# Patient Record
Sex: Male | Born: 1985 | Race: White | Hispanic: No | Marital: Single | Smoking: Current every day smoker
Health system: Southern US, Community
[De-identification: ages and names within clinical notes are randomized; demographics above are authoritative.]

## PROBLEM LIST (undated history)

## (undated) DIAGNOSIS — R569 Unspecified convulsions: Secondary | ICD-10-CM

## (undated) DIAGNOSIS — F909 Attention-deficit hyperactivity disorder, unspecified type: Secondary | ICD-10-CM

## (undated) DIAGNOSIS — K56609 Unspecified intestinal obstruction, unspecified as to partial versus complete obstruction: Secondary | ICD-10-CM

## (undated) DIAGNOSIS — I219 Acute myocardial infarction, unspecified: Secondary | ICD-10-CM

## (undated) HISTORY — PX: APPENDECTOMY: SHX54

## (undated) HISTORY — PX: ABDOMINAL SURGERY: SHX537

## (undated) HISTORY — PX: FRACTURE SURGERY: SHX138

---

## 2014-08-27 ENCOUNTER — Emergency Department (HOSPITAL_COMMUNITY)
Admission: EM | Admit: 2014-08-27 | Discharge: 2014-08-27 | Disposition: A | Discharge status: Home / Self Care | Payer: Self-pay | Attending: Emergency Medicine | Admitting: Emergency Medicine

## 2014-08-27 ENCOUNTER — Encounter (HOSPITAL_COMMUNITY): Payer: Self-pay | Admitting: *Deleted

## 2014-08-27 ENCOUNTER — Emergency Department (HOSPITAL_COMMUNITY): Payer: Self-pay

## 2014-08-27 DIAGNOSIS — Z8719 Personal history of other diseases of the digestive system: Secondary | ICD-10-CM | POA: Insufficient documentation

## 2014-08-27 DIAGNOSIS — Y92410 Unspecified street and highway as the place of occurrence of the external cause: Secondary | ICD-10-CM | POA: Insufficient documentation

## 2014-08-27 DIAGNOSIS — Y998 Other external cause status: Secondary | ICD-10-CM | POA: Insufficient documentation

## 2014-08-27 DIAGNOSIS — Z720 Tobacco use: Secondary | ICD-10-CM | POA: Insufficient documentation

## 2014-08-27 DIAGNOSIS — Y9389 Activity, other specified: Secondary | ICD-10-CM | POA: Insufficient documentation

## 2014-08-27 DIAGNOSIS — S83422A Sprain of lateral collateral ligament of left knee, initial encounter: Secondary | ICD-10-CM

## 2014-08-27 DIAGNOSIS — S8392XA Sprain of unspecified site of left knee, initial encounter: Secondary | ICD-10-CM | POA: Insufficient documentation

## 2014-08-27 DIAGNOSIS — S8002XA Contusion of left knee, initial encounter: Secondary | ICD-10-CM | POA: Insufficient documentation

## 2014-08-27 DIAGNOSIS — M25562 Pain in left knee: Secondary | ICD-10-CM

## 2014-08-27 HISTORY — DX: Unspecified intestinal obstruction, unspecified as to partial versus complete obstruction: K56.609

## 2014-08-27 HISTORY — DX: Unspecified convulsions: R56.9

## 2014-08-27 MED ORDER — OXYCODONE HCL 5 MG PO TABS: 5.0000 mg | ORAL_TABLET | ORAL | Status: DC | PRN

## 2014-08-27 NOTE — ED Notes (Signed)
MD at bedside. 
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
## 2014-08-27 NOTE — Discharge Instructions (Signed)
Knee Ligament Injury, Arthroscopy Arthroscopy is a surgical technique in which your health care provider examines your knee through a small, pencil-sized telescope (arthroscope). Often, repairs to injured ligaments can be done with instruments in the arthroscope. Arthroscopy is less invasive than open-knee surgery.  LET [facility] CARE PROVIDER KNOW ABOUT:  Any allergies you have.  All medicines you are taking, including vitamins, herbs, eye drops, creams, and over-the-counter medicines.  Previous problems you or members of your family have had with the use of anesthetics.  Any blood disorders you have.  Previous surgeries you have had.  Medical conditions you have. RISKS AND COMPLICATIONS  Generally, this is a safe procedure. However, as with any procedure, problems can occur. Possible problems include:  Infection.  Bleeding.  Stiffness. BEFORE THE PROCEDURE  Ask your health care provider about changing or stopping any regular medicines. Avoid taking aspirin or blood thinners as directed by your health care provider.   Do not eat or drink anything after midnight the night before surgery.   If you smoke, do not smoke for at least 2 weeks before your surgery.   Do not drink alcohol starting the day before your surgery.   Let your health care provider know if you develop a cold or any infection before your surgery.   Arrange for someone to drive you home after the surgery or after your hospital stay. Also arrange for someone to help you with activities during recovery.  PROCEDURE  Small monitors will be put on your body. They are used to check your heart, blood pressure, and oxygen levels.   An IV access tube will be put into one of your veins. Medicine will be able to flow directly into your body through this IV tube.   You might be given a medicine to help you relax (sedative).   You will be given a medicine that makes you go to sleep (general anesthetic), and a  breathing tube will be placed into your lungs during the procedure.  Several small incisions are made in your knee. Saline fluid is placed into one of the incisions to expand the knee and clear away any blood in the knee.  Your health care provider will insert the arthroscope to examine the injured knee.  During arthroscopy, your health care provider may find a partial or complete tear in a ligament.  Tools can be inserted through the other incisions to repair the injured ligaments.  The incisions are then closed with absorbable stitches and covered with dressings. AFTER THE PROCEDURE  You will be taken to the recovery area where you will be monitored.  When you are awake, stable, and taking fluids without problems, you will be allowed to go home. Document Released: 01/11/2000 Document Revised: 01/18/2013 Document Reviewed: 08/25/2012 [facility] Patient Information 2015 [place], [place]. This information is not intended to replace advice given to you by your health care provider. Make sure you discuss any questions you have with your health care provider.

## 2016-01-02 DIAGNOSIS — F17210 Nicotine dependence, cigarettes, uncomplicated: Secondary | ICD-10-CM

## 2016-01-02 DIAGNOSIS — E669 Obesity, unspecified: Secondary | ICD-10-CM

## 2016-01-02 DIAGNOSIS — F1210 Cannabis abuse, uncomplicated: Secondary | ICD-10-CM

## 2016-02-14 ENCOUNTER — Encounter (HOSPITAL_COMMUNITY): Payer: Self-pay

## 2016-02-14 ENCOUNTER — Emergency Department (HOSPITAL_COMMUNITY): Payer: Self-pay

## 2016-02-14 ENCOUNTER — Emergency Department (HOSPITAL_COMMUNITY)
Admission: EM | Admit: 2016-02-14 | Discharge: 2016-02-14 | Disposition: A | Discharge status: Home / Self Care | Payer: Self-pay | Attending: Emergency Medicine | Admitting: Emergency Medicine

## 2016-02-14 DIAGNOSIS — R1013 Epigastric pain: Secondary | ICD-10-CM

## 2016-02-14 DIAGNOSIS — F17200 Nicotine dependence, unspecified, uncomplicated: Secondary | ICD-10-CM | POA: Insufficient documentation

## 2016-02-14 DIAGNOSIS — K529 Noninfective gastroenteritis and colitis, unspecified: Secondary | ICD-10-CM | POA: Insufficient documentation

## 2016-02-14 LAB — CBC WITH DIFFERENTIAL/PLATELET
Basophils Absolute: 0 10*3/uL (ref 0.0–0.1)
Basophils Relative: 0 %
Eosinophils Absolute: 0.2 10*3/uL (ref 0.0–0.7)
Eosinophils Relative: 2 %
HCT: 46.9 % (ref 39.0–52.0)
HEMOGLOBIN: 16 g/dL (ref 13.0–17.0)
Lymphocytes Relative: 21 %
Lymphs Abs: 2.3 10*3/uL (ref 0.7–4.0)
MCH: 29.6 pg (ref 26.0–34.0)
MCHC: 34.1 g/dL (ref 30.0–36.0)
MCV: 86.7 fL (ref 78.0–100.0)
MONO ABS: 1 10*3/uL (ref 0.1–1.0)
Monocytes Relative: 9 %
NEUTROS ABS: 7.5 10*3/uL (ref 1.7–7.7)
NEUTROS PCT: 68 %
Platelets: 248 10*3/uL (ref 150–400)
RBC: 5.41 MIL/uL (ref 4.22–5.81)
RDW: 14.1 % (ref 11.5–15.5)
WBC: 10.9 10*3/uL — ABNORMAL HIGH (ref 4.0–10.5)

## 2016-02-14 LAB — COMPREHENSIVE METABOLIC PANEL
ALK PHOS: 93 U/L (ref 38–126)
ALT: 28 U/L (ref 17–63)
AST: 22 U/L (ref 15–41)
Albumin: 4.1 g/dL (ref 3.5–5.0)
Anion gap: 9 (ref 5–15)
BUN: 9 mg/dL (ref 6–20)
CO2: 22 mmol/L (ref 22–32)
CREATININE: 0.82 mg/dL (ref 0.61–1.24)
Calcium: 9.2 mg/dL (ref 8.9–10.3)
Chloride: 106 mmol/L (ref 101–111)
GFR calc Af Amer: >60 mL/min (ref >60)
Glucose, Bld: 85 mg/dL (ref 65–99)
POTASSIUM: 3.8 mmol/L (ref 3.5–5.1)
Sodium: 137 mmol/L (ref 135–145)
Total Bilirubin: 0.8 mg/dL (ref 0.3–1.2)
Total Protein: 7.1 g/dL (ref 6.5–8.1)

## 2016-02-14 LAB — LIPASE, BLOOD: Lipase: 14 U/L (ref 11–51)

## 2016-02-14 LAB — I-STAT CG4 LACTIC ACID, ED: Lactic Acid, Venous: 0.7 mmol/L (ref 0.5–1.9)

## 2016-02-14 MED ORDER — DICYCLOMINE HCL 20 MG PO TABS
20.0000 mg | ORAL_TABLET | Freq: Three times a day (TID) | ORAL | 0 refills | Status: DC | PRN
Start: 2016-02-14 — End: 2017-09-30

## 2016-02-14 MED ORDER — SODIUM CHLORIDE 0.9 % IV BOLUS (SEPSIS)
1000.0000 mL | Freq: Once | INTRAVENOUS | Status: AC
  Administered 2016-02-14: 1000 mL via INTRAVENOUS

## 2016-02-14 MED ORDER — IOPAMIDOL (ISOVUE-300) INJECTION 61%
INTRAVENOUS | Status: AC
  Administered 2016-02-14: 100 mL via INTRAVENOUS
  Filled 2016-02-14: qty 100

## 2016-02-14 MED ORDER — ONDANSETRON HCL 4 MG/2ML IJ SOLN
4.0000 mg | Freq: Once | INTRAMUSCULAR | Status: AC
  Administered 2016-02-14: 4 mg via INTRAVENOUS
  Filled 2016-02-14: qty 2

## 2016-02-14 MED ORDER — ONDANSETRON 4 MG PO TBDP: 4.0000 mg | ORAL_TABLET | Freq: Three times a day (TID) | ORAL | 0 refills | Status: DC | PRN

## 2016-02-14 MED ORDER — HYDROMORPHONE HCL 2 MG/ML IJ SOLN
1.0000 mg | Freq: Once | INTRAMUSCULAR | Status: AC
  Administered 2016-02-14: 1 mg via INTRAVENOUS
  Filled 2016-02-14: qty 1

## 2016-02-14 MED ORDER — KETOROLAC TROMETHAMINE 30 MG/ML IJ SOLN
30.0000 mg | Freq: Once | INTRAMUSCULAR | Status: AC
  Administered 2016-02-14: 30 mg via INTRAVENOUS
  Filled 2016-02-14: qty 1

## 2016-02-14 MED ORDER — CIPROFLOXACIN HCL 500 MG PO TABS: 500.0000 mg | ORAL_TABLET | Freq: Two times a day (BID) | ORAL | 0 refills | Status: AC

## 2016-02-14 MED ORDER — METRONIDAZOLE 500 MG PO TABS: 500.0000 mg | ORAL_TABLET | Freq: Two times a day (BID) | ORAL | 0 refills | Status: AC

## 2016-02-14 NOTE — ED Notes (Signed)
Pt will be picked up by friend, instructed not to drive due to medications

## 2016-02-14 NOTE — ED Triage Notes (Signed)
Pt arrived via EMS from home c/o generalized abdominal pain x3 days worse today.  Hx of bowel obstruction with surgical intervention 7 years ago.

## 2016-02-16 NOTE — ED Provider Notes (Signed)
[facility]-EMERGENCY DEPT Provider Note   CSN: [id number] Arrival date & time: 02/14/16  1619     History   Chief Complaint Chief Complaint  Patient presents with  . Abdominal Pain    HPI Cameron Preston is a 31 y.o. male.  HPI  Presents with concern for generalized abdominal pain, worse in the epigastrium. Patient reports isn't present for approximately one month, however worsened 3 days ago. Describes it as a grabbing severe cramping pain. History of bowel obstruction with exploratory laparotomy in the past, and this feels similar. Reports he's also had nausea and vomiting as well as diarrhea. No urinary symptoms. No fevers. Past Medical History:  Diagnosis Date  . Bowel obstruction   . Seizures (HCC)     There are no active problems to display for this patient.   Past Surgical History:  Procedure Laterality Date  . ABDOMINAL SURGERY    . APPENDECTOMY    . FRACTURE SURGERY     hand repair       Home Medications    Prior to Admission medications   Medication Sig Start Date End Date Taking? Authorizing Provider  ciprofloxacin (CIPRO) 500 MG tablet Take 1 tablet (500 mg total) by mouth every 12 (twelve) hours. 02/14/16 02/21/16  Alvira Monday, MD  dicyclomine (BENTYL) 20 MG tablet Take 1 tablet (20 mg total) by mouth 3 (three) times daily as needed for spasms. 02/14/16   Alvira Monday, MD  ibuprofen (ADVIL,MOTRIN) 200 MG tablet Take 200 mg by mouth every 6 (six) hours as needed.    Historical Provider, MD  metroNIDAZOLE (FLAGYL) 500 MG tablet Take 1 tablet (500 mg total) by mouth 2 (two) times daily. 02/14/16 02/21/16  Alvira Monday, MD  ondansetron (ZOFRAN ODT) 4 MG disintegrating tablet Take 1 tablet (4 mg total) by mouth every 8 (eight) hours as needed for nausea or vomiting. 02/14/16   Alvira Monday, MD  oxyCODONE (ROXICODONE) 5 MG immediate release tablet Take 1 tablet (5 mg total) by mouth every 4 (four) hours as needed for severe pain. 08/27/14   Ames Dura, MD     Family History History reviewed. No pertinent family history.  Social History Social History  Substance Use Topics  . Smoking status: Current Some Day Smoker    Packs/day: 0.50  . Smokeless tobacco: Never Used  . Alcohol use No     Comment: o     Allergies   Klonopin [clonazepam]; Valium [diazepam]; Xanax [alprazolam]; and Tylenol [acetaminophen]   Review of Systems Review of Systems  Constitutional: Negative for fever.  HENT: Negative for sore throat.   Eyes: Negative for visual disturbance.  Respiratory: Negative for shortness of breath.   Cardiovascular: Negative for chest pain.  Gastrointestinal: Positive for abdominal pain, diarrhea, nausea and vomiting.  Genitourinary: Negative for difficulty urinating.  Musculoskeletal: Negative for back pain and neck stiffness.  Skin: Negative for rash.  Neurological: Negative for syncope and headaches.     Physical Exam Updated Vital Signs BP 106/75   Pulse 74   Temp 98.5 F (36.9 C) (Oral)   Ht 5\' 7"  (1.702 m)   Wt 180 lb (81.6 kg)   SpO2 95%   BMI 28.19 kg/m   Physical Exam  Constitutional: He is oriented to person, place, and time. He appears well-developed and well-nourished. No distress.  HENT:  Head: Normocephalic and atraumatic.  Eyes: Conjunctivae and EOM are normal.  Neck: Normal range of motion.  Cardiovascular: Normal rate, regular rhythm, normal heart sounds and intact  distal pulses.  Exam reveals no gallop and no friction rub.   No murmur heard. Pulmonary/Chest: Effort normal and breath sounds normal. No respiratory distress. He has no wheezes. He has no rales.  Abdominal: Soft. He exhibits no distension. There is tenderness (epigastric worse however diffuse). There is no guarding.  Musculoskeletal: He exhibits no edema.  Neurological: He is alert and oriented to person, place, and time.  Skin: Skin is warm and dry. He is not diaphoretic.  Nursing note and vitals reviewed.    ED Treatments /  Results  Labs (all labs ordered are listed, but only abnormal results are displayed) Labs Reviewed  CBC WITH DIFFERENTIAL/PLATELET - Abnormal; Notable for the following:       Result Value   WBC 10.9 (*)    All other components within normal limits  COMPREHENSIVE METABOLIC PANEL  LIPASE, BLOOD  I-STAT CG4 LACTIC ACID, ED    EKG  EKG Interpretation None       Radiology Ct Abdomen Pelvis W Contrast  Result Date: 02/14/2016 CLINICAL DATA:  31 year old male with generalized abdominal pain for the past 3 days worsened today. History of small-bowel obstruction 7 years ago stating that abdominal discomfort feels similar today. EXAM: CT ABDOMEN AND PELVIS WITH CONTRAST TECHNIQUE: Multidetector CT imaging of the abdomen and pelvis was performed using the standard protocol following bolus administration of intravenous contrast. CONTRAST:  ISOVUE-300 IOPAMIDOL (ISOVUE-300) INJECTION 61% COMPARISON:  CT the abdomen and pelvis 04/16/2010. FINDINGS: Comment: Portions of today's examination are limited by considerable gross patient motion. Lower chest: Unremarkable. Hepatobiliary: No cystic or solid hepatic lesions. No intra or extrahepatic biliary ductal dilatation. Gallbladder is normal in appearance. Pancreas: No pancreatic mass. No pancreatic ductal dilatation. No pancreatic or peripancreatic fluid or inflammatory changes. Spleen: Unremarkable. Adrenals/Urinary Tract: Bilateral adrenal glands and bilateral kidneys are normal in appearance. No hydroureteronephrosis. Urinary bladder is normal in appearance. Stomach/Bowel: The appearance of the stomach is normal. There is some mild dilatation of the proximal jejunum which measures up to 4.1 cm in diameter. Thereafter, the mid jejunum demonstrates some circumferential wall thickening. More distal loops of small bowel are relatively normal in caliber, while some are decompressed. Gas, stool and liquid is noted throughout the colon and rectum. Status  post cholecystectomy. Surgical clips are noted in the central small bowel mesenteric. Vascular/Lymphatic: Mild atherosclerotic disease noted in the pelvic vasculature. No lymphadenopathy noted in the abdomen or pelvis. Reproductive: Prostate gland and seminal vesicles are unremarkable in appearance. Other: No significant volume of ascites.  No pneumoperitoneum. Musculoskeletal: There are no aggressive appearing lytic or blastic lesions noted in the visualized portions of the skeleton. IMPRESSION: 1. While there is very mild dilatation of the proximal jejunum, this does not appear to be related to frank bowel obstruction. The mid jejunum demonstrates some mural thickening. Overall, findings are favored to reflect an enteritis. 2. Mild atherosclerosis. 3. Additional incidental findings, as above. Electronically Signed   By: Trudie Reed M.D.   On: 02/14/2016 17:50    Procedures Procedures (including critical care time)  Medications Ordered in ED Medications  sodium chloride 0.9 % bolus 1,000 mL (0 mLs Intravenous Stopped 02/14/16 1910)  HYDROmorphone (DILAUDID) injection 1 mg (1 mg Intravenous Given 02/14/16 1701)  ondansetron (ZOFRAN) injection 4 mg (4 mg Intravenous Given 02/14/16 1700)  iopamidol (ISOVUE-300) 61 % injection (100 mLs Intravenous Contrast Given 02/14/16 1714)  ketorolac (TORADOL) 30 MG/ML injection 30 mg (30 mg Intravenous Given 02/14/16 1902)     Initial  Impression / Assessment and Plan / ED Course  I have reviewed the triage vital signs and the nursing notes.  Pertinent labs & imaging results that were available during my care of the patient were reviewed by me and considered in my medical decision making (see chart for details).    31 year old male with a history of prior exploratory laparotomy in setting of suspected bowel obstruction, presents with concern for nausea, vomiting and abdominal pain. CT abdomen and pelvis showed no sign of obstruction, however did show mild  dilation of the proximal jejunum with some mural thickening, favored to represent an enteritis. Other labs did not show significant findings.  Suspect likely infectious etiology. Possible viral, however we'll treat for bacterial infection with Flagyl and Cipro. Patient given prescription for Zofran, Bentyl and antibiotics. Patient discharged in stable condition with understanding of reasons to return.   Final Clinical Impressions(s) / ED Diagnoses   Final diagnoses:  Epigastric pain  Jejunitis    New Prescriptions Discharge Medication List as of 02/14/2016  6:57 PM    START taking these medications   Details  ciprofloxacin (CIPRO) 500 MG tablet Take 1 tablet (500 mg total) by mouth every 12 (twelve) hours., Starting Thu 02/14/2016, Until Thu 02/21/2016, Print    dicyclomine (BENTYL) 20 MG tablet Take 1 tablet (20 mg total) by mouth 3 (three) times daily as needed for spasms., Starting Thu 02/14/2016, Print    metroNIDAZOLE (FLAGYL) 500 MG tablet Take 1 tablet (500 mg total) by mouth 2 (two) times daily., Starting Thu 02/14/2016, Until Thu 02/21/2016, Print    ondansetron (ZOFRAN ODT) 4 MG disintegrating tablet Take 1 tablet (4 mg total) by mouth every 8 (eight) hours as needed for nausea or vomiting., Starting Thu 02/14/2016, Print         Alvira Monday, MD 02/16/16 [phone]

## 2017-05-27 DIAGNOSIS — I219 Acute myocardial infarction, unspecified: Secondary | ICD-10-CM

## 2017-05-27 HISTORY — DX: Acute myocardial infarction, unspecified: I21.9

## 2017-09-30 ENCOUNTER — Other Ambulatory Visit: Payer: Self-pay

## 2017-09-30 ENCOUNTER — Encounter (HOSPITAL_COMMUNITY): Payer: Self-pay | Admitting: Emergency Medicine

## 2017-09-30 ENCOUNTER — Emergency Department (HOSPITAL_COMMUNITY): Payer: Self-pay

## 2017-09-30 ENCOUNTER — Inpatient Hospital Stay (HOSPITAL_COMMUNITY)
Admission: EM | Admit: 2017-09-30 | Discharge: 2017-10-06 | DRG: 464 | Disposition: A | Discharge status: Home / Self Care | Payer: Self-pay | Attending: Family Medicine | Admitting: Family Medicine

## 2017-09-30 DIAGNOSIS — L02416 Cutaneous abscess of left lower limb: Secondary | ICD-10-CM | POA: Diagnosis present

## 2017-09-30 DIAGNOSIS — B9562 Methicillin resistant Staphylococcus aureus infection as the cause of diseases classified elsewhere: Secondary | ICD-10-CM | POA: Diagnosis present

## 2017-09-30 DIAGNOSIS — L03119 Cellulitis of unspecified part of limb: Secondary | ICD-10-CM

## 2017-09-30 DIAGNOSIS — F1210 Cannabis abuse, uncomplicated: Secondary | ICD-10-CM | POA: Diagnosis present

## 2017-09-30 DIAGNOSIS — I252 Old myocardial infarction: Secondary | ICD-10-CM

## 2017-09-30 DIAGNOSIS — L02419 Cutaneous abscess of limb, unspecified: Secondary | ICD-10-CM | POA: Diagnosis present

## 2017-09-30 DIAGNOSIS — F17200 Nicotine dependence, unspecified, uncomplicated: Secondary | ICD-10-CM | POA: Diagnosis present

## 2017-09-30 DIAGNOSIS — F901 Attention-deficit hyperactivity disorder, predominantly hyperactive type: Secondary | ICD-10-CM

## 2017-09-30 DIAGNOSIS — Z8614 Personal history of Methicillin resistant Staphylococcus aureus infection: Secondary | ICD-10-CM

## 2017-09-30 DIAGNOSIS — L03116 Cellulitis of left lower limb: Secondary | ICD-10-CM | POA: Diagnosis present

## 2017-09-30 DIAGNOSIS — M71061 Abscess of bursa, right knee: Principal | ICD-10-CM | POA: Diagnosis present

## 2017-09-30 DIAGNOSIS — F909 Attention-deficit hyperactivity disorder, unspecified type: Secondary | ICD-10-CM | POA: Diagnosis present

## 2017-09-30 DIAGNOSIS — G40909 Epilepsy, unspecified, not intractable, without status epilepticus: Secondary | ICD-10-CM

## 2017-09-30 DIAGNOSIS — Z9114 Patient's other noncompliance with medication regimen: Secondary | ICD-10-CM

## 2017-09-30 DIAGNOSIS — L03115 Cellulitis of right lower limb: Secondary | ICD-10-CM | POA: Diagnosis present

## 2017-09-30 DIAGNOSIS — A419 Sepsis, unspecified organism: Secondary | ICD-10-CM | POA: Diagnosis present

## 2017-09-30 HISTORY — DX: Acute myocardial infarction, unspecified: I21.9

## 2017-09-30 HISTORY — DX: Attention-deficit hyperactivity disorder, unspecified type: F90.9

## 2017-09-30 LAB — CBC WITH DIFFERENTIAL/PLATELET
Abs Immature Granulocytes: 0.1 10*3/uL (ref 0.0–0.1)
Basophils Absolute: 0.1 10*3/uL (ref 0.0–0.1)
Basophils Relative: 0 %
Eosinophils Absolute: 0.1 10*3/uL (ref 0.0–0.7)
Eosinophils Relative: 0 %
HCT: 46.1 % (ref 39.0–52.0)
HEMOGLOBIN: 15 g/dL (ref 13.0–17.0)
IMMATURE GRANULOCYTES: 0 %
LYMPHS ABS: 2 10*3/uL (ref 0.7–4.0)
LYMPHS PCT: 11 %
MCH: 29.3 pg (ref 26.0–34.0)
MCHC: 32.5 g/dL (ref 30.0–36.0)
MCV: 90 fL (ref 78.0–100.0)
Monocytes Absolute: 1.6 10*3/uL — ABNORMAL HIGH (ref 0.1–1.0)
Monocytes Relative: 9 %
NEUTROS PCT: 80 %
Neutro Abs: 14.7 10*3/uL — ABNORMAL HIGH (ref 1.7–7.7)
Platelets: 291 10*3/uL (ref 150–400)
RBC: 5.12 MIL/uL (ref 4.22–5.81)
RDW: 13.2 % (ref 11.5–15.5)
WBC: 18.5 10*3/uL — AB (ref 4.0–10.5)

## 2017-09-30 LAB — MRSA PCR SCREENING: MRSA by PCR: NEGATIVE

## 2017-09-30 LAB — COMPREHENSIVE METABOLIC PANEL
ALT: 23 U/L (ref 0–44)
AST: 28 U/L (ref 15–41)
Albumin: 3.9 g/dL (ref 3.5–5.0)
Alkaline Phosphatase: 99 U/L (ref 38–126)
Anion gap: 11 (ref 5–15)
BUN: 9 mg/dL (ref 6–20)
CHLORIDE: 102 mmol/L (ref 98–111)
CO2: 24 mmol/L (ref 22–32)
Calcium: 9 mg/dL (ref 8.9–10.3)
Creatinine, Ser: 0.72 mg/dL (ref 0.61–1.24)
GFR calc non Af Amer: >60 mL/min (ref >60)
Glucose, Bld: 123 mg/dL — ABNORMAL HIGH (ref 70–99)
Potassium: 3.6 mmol/L (ref 3.5–5.1)
Sodium: 137 mmol/L (ref 135–145)
Total Bilirubin: 0.9 mg/dL (ref 0.3–1.2)
Total Protein: 7.6 g/dL (ref 6.5–8.1)

## 2017-09-30 LAB — I-STAT CG4 LACTIC ACID, ED: LACTIC ACID, VENOUS: 0.99 mmol/L (ref 0.5–1.9)

## 2017-09-30 LAB — VALPROIC ACID LEVEL: Valproic Acid Lvl: <10 ug/mL — ABNORMAL LOW (ref 50.0–100.0)

## 2017-09-30 LAB — PROCALCITONIN: Procalcitonin: 0.27 ng/mL

## 2017-09-30 MED ORDER — DIVALPROEX SODIUM 250 MG PO DR TAB
250.0000 mg | DELAYED_RELEASE_TABLET | Freq: Two times a day (BID) | ORAL | Status: DC
  Administered 2017-09-30 (×2): 250 mg via ORAL
  Filled 2017-09-30 (×2): qty 1

## 2017-09-30 MED ORDER — FAMOTIDINE 20 MG PO TABS
10.0000 mg | ORAL_TABLET | Freq: Every day | ORAL | Status: DC
  Administered 2017-09-30: 10 mg via ORAL
  Filled 2017-09-30: qty 1

## 2017-09-30 MED ORDER — POVIDONE-IODINE 10 % EX SWAB: 2.0000 "application " | Freq: Once | CUTANEOUS | Status: DC

## 2017-09-30 MED ORDER — IBUPROFEN 600 MG PO TABS: 600.0000 mg | ORAL_TABLET | Freq: Four times a day (QID) | ORAL | Status: DC | PRN

## 2017-09-30 MED ORDER — CEFAZOLIN SODIUM-DEXTROSE 2-4 GM/100ML-% IV SOLN
2.0000 g | INTRAVENOUS | Status: AC
  Administered 2017-10-01: 2 g via INTRAVENOUS
  Filled 2017-09-30 (×2): qty 100

## 2017-09-30 MED ORDER — ENOXAPARIN SODIUM 40 MG/0.4ML ~~LOC~~ SOLN
40.0000 mg | SUBCUTANEOUS | Status: DC
  Administered 2017-09-30: 40 mg via SUBCUTANEOUS
  Filled 2017-09-30: qty 0.4

## 2017-09-30 MED ORDER — CHLORHEXIDINE GLUCONATE 4 % EX LIQD
60.0000 mL | Freq: Once | CUTANEOUS | Status: DC
  Filled 2017-09-30: qty 60

## 2017-09-30 MED ORDER — VANCOMYCIN HCL IN DEXTROSE 750-5 MG/150ML-% IV SOLN
750.0000 mg | Freq: Three times a day (TID) | INTRAVENOUS | Status: DC
  Administered 2017-09-30 – 2017-10-01 (×3): 750 mg via INTRAVENOUS
  Filled 2017-09-30 (×4): qty 150

## 2017-09-30 MED ORDER — ONDANSETRON HCL 4 MG PO TABS: 4.0000 mg | ORAL_TABLET | Freq: Four times a day (QID) | ORAL | Status: DC | PRN

## 2017-09-30 MED ORDER — PIPERACILLIN-TAZOBACTAM 3.375 G IVPB 30 MIN
3.3750 g | Freq: Once | INTRAVENOUS | Status: AC
  Administered 2017-09-30: 3.375 g via INTRAVENOUS
  Filled 2017-09-30: qty 50

## 2017-09-30 MED ORDER — NICOTINE 14 MG/24HR TD PT24
14.0000 mg | MEDICATED_PATCH | Freq: Every day | TRANSDERMAL | Status: DC
  Administered 2017-09-30: 14 mg via TRANSDERMAL
  Filled 2017-09-30: qty 1

## 2017-09-30 MED ORDER — SODIUM CHLORIDE 0.9 % IV BOLUS
1000.0000 mL | Freq: Once | INTRAVENOUS | Status: AC
  Administered 2017-09-30: 1000 mL via INTRAVENOUS

## 2017-09-30 MED ORDER — ACETAMINOPHEN 325 MG PO TABS: 650.0000 mg | ORAL_TABLET | Freq: Four times a day (QID) | ORAL | Status: DC | PRN

## 2017-09-30 MED ORDER — VANCOMYCIN HCL IN DEXTROSE 1-5 GM/200ML-% IV SOLN
1000.0000 mg | Freq: Once | INTRAVENOUS | Status: AC
  Administered 2017-09-30: 1000 mg via INTRAVENOUS
  Filled 2017-09-30: qty 200

## 2017-09-30 MED ORDER — ONDANSETRON HCL 4 MG/2ML IJ SOLN: 4.0000 mg | Freq: Four times a day (QID) | INTRAMUSCULAR | Status: DC | PRN

## 2017-09-30 MED ORDER — HYDROCODONE-ACETAMINOPHEN 5-325 MG PO TABS
1.0000 | ORAL_TABLET | ORAL | Status: DC | PRN
  Administered 2017-09-30 – 2017-10-01 (×5): 2 via ORAL
  Filled 2017-09-30 (×5): qty 2

## 2017-09-30 MED ORDER — PIPERACILLIN-TAZOBACTAM 3.375 G IVPB
3.3750 g | Freq: Three times a day (TID) | INTRAVENOUS | Status: DC
  Administered 2017-09-30 – 2017-10-01 (×3): 3.375 g via INTRAVENOUS
  Filled 2017-09-30 (×2): qty 50

## 2017-09-30 MED ORDER — LACTATED RINGERS IV SOLN
INTRAVENOUS | Status: DC
  Administered 2017-09-30 – 2017-10-01 (×2): via INTRAVENOUS

## 2017-09-30 MED ORDER — MORPHINE SULFATE (PF) 2 MG/ML IV SOLN
2.0000 mg | INTRAVENOUS | Status: DC | PRN
  Administered 2017-09-30 – 2017-10-01 (×2): 2 mg via INTRAVENOUS
  Filled 2017-09-30 (×2): qty 1

## 2017-09-30 NOTE — Consult Note (Signed)
Reason for Consult:Knee infections Referring Physician: [name] is an 32 y.o. male.  HPI: Cameron Preston has been suffering with a right knee infection for about 2.5-3 weeks. There was no known antecedent event. About a week ago a pustule developed and started draining pus. It has drained off and on since then. He sought care at [facility] in the last couple of days but he says they refused to treat him. Late last week he feels like he got bitten on the back of his left knee. Since then that knee has gotten red, swollen, and painful and has been draining pus. He denies any constitutional symptoms of a more widespread infection. 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  Past Medical History:  Diagnosis Date  . ADHD   . Bowel obstruction ([place])   . Myocardial infarction ([place]) 05/2017   reports "I had a heart attack" - left the hospital AMA but has followed up with cardiology  . Seizures ([place])    last seizure was Saturday; he is unable to afford medication and so has been noncompliant    Past Surgical History:  Procedure Laterality Date  . ABDOMINAL SURGERY    . APPENDECTOMY    . FRACTURE SURGERY     hand repair    Family History  Problem Relation Age of Onset  . Chronic infections Mother   . Other Sister        MVC    Social History:  reports that he has been smoking. He has a 8.50 pack-year smoking history. He has never used smokeless tobacco. He reports that he has current or past drug history. Drug: Marijuana. He reports that he does not drink alcohol.  Allergies:  Allergies  Allergen Reactions  . Klonopin [Clonazepam] Shortness Of Breath and Swelling    Throat swelling and tightness  . Valium [Diazepam] Other (See Comments)    Altered mental status, syncope and confusion  . Xanax [Alprazolam] Shortness Of Breath and Swelling    Throat swelling and tightness  . Tylenol [Acetaminophen] Other (See Comments)    Sneezing, causing stomach problems     Medications: I have reviewed the patient's current medications.  Results for orders placed or performed during the hospital encounter of 09/30/17 (from the past 48 hour(s))  Comprehensive metabolic panel     Status: Abnormal   Collection Time: 09/30/17  2:27 AM  Result Value Ref Range   Sodium 137 135 - 145 mmol/L   Potassium 3.6 3.5 - 5.1 mmol/L   Chloride 102 98 - 111 mmol/L   CO2 24 22 - 32 mmol/L   Glucose, Bld 123 (H) 70 - 99 mg/dL   BUN 9 6 - 20 mg/dL   Creatinine, Ser 0.72 0.61 - 1.24 mg/dL   Calcium 9.0 8.9 - 10.3 mg/dL   Total Protein 7.6 6.5 - 8.1 g/dL   Albumin 3.9 3.5 - 5.0 g/dL   AST 28 15 - 41 U/L   ALT 23 0 - 44 U/L   Alkaline Phosphatase 99 38 - 126 U/L   Total Bilirubin 0.9 0.3 - 1.2 mg/dL   GFR calc non Af Amer >60 >60 mL/min   GFR calc Af Amer >60 >60 mL/min    Comment: (NOTE) The eGFR has been calculated using the CKD EPI equation. This calculation has not been validated in all clinical situations. eGFR's persistently <60 mL/min signify possible Chronic Kidney Disease.    Anion gap 11 5 -  15    Comment: Performed at [facility], [place] [address], [place], [place] [address]  CBC with Differential     Status: Abnormal   Collection Time: 09/30/17  2:27 AM  Result Value Ref Range   WBC 18.5 (H) 4.0 - 10.5 K/uL   RBC 5.12 4.22 - 5.81 MIL/uL   Hemoglobin 15.0 13.0 - 17.0 g/dL   HCT 46.1 39.0 - 52.0 %   MCV 90.0 78.0 - 100.0 fL   MCH 29.3 26.0 - 34.0 pg   MCHC 32.5 30.0 - 36.0 g/dL   RDW 13.2 11.5 - 15.5 %   Platelets 291 150 - 400 K/uL   Neutrophils Relative % 80 %   Neutro Abs 14.7 (H) 1.7 - 7.7 K/uL   Lymphocytes Relative 11 %   Lymphs Abs 2.0 0.7 - 4.0 K/uL   Monocytes Relative 9 %   Monocytes Absolute 1.6 (H) 0.1 - 1.0 K/uL   Eosinophils Relative 0 %   Eosinophils Absolute 0.1 0.0 - 0.7 K/uL   Basophils Relative 0 %   Basophils Absolute 0.1 0.0 - 0.1 K/uL   Immature Granulocytes 0 %   Abs Immature Granulocytes 0.1 0.0 - 0.1 K/uL     Comment: Performed at [facility], [address], [place], [place] [address]  I-Stat CG4 Lactic Acid, ED     Status: None   Collection Time: 09/30/17  2:34 AM  Result Value Ref Range   Lactic Acid, Venous 0.99 0.5 - 1.9 mmol/L  Valproic acid level     Status: Abnormal   Collection Time: 09/30/17  4:35 AM  Result Value Ref Range   Valproic Acid Lvl <10 (L) 50.0 - 100.0 ug/mL    Comment: RESULTS CONFIRMED BY MANUAL DILUTION Performed at [facility] [address], [place], [place] [address]     Dg Knee Complete 4 Views Left  Result Date: 09/30/2017 CLINICAL DATA:  Initial evaluation for acute swelling, redness, cellulitis, recent abscess to right knee EXAM: LEFT KNEE - COMPLETE 4+ VIEW COMPARISON:  None. FINDINGS: No acute fracture or dislocation. No significant joint effusion. Pellegrini-Stieda lesion present at the medial femoral condyle, suggesting prior/remote avulsion injury involving the medial collateral ligament. Joint spaces maintained without evidence for significant degenerative or erosive arthropathy. No appreciable soft tissue swelling or other abnormality. IMPRESSION: 1. No acute soft tissue abnormality identified about the knee. 2. No acute osseous abnormality about the left knee. 3. Pellegrini-Stieda lesion at the medial femoral condyle, suggesting prior medial collateral ligament injury. Electronically Signed   By: Jeannine Boga M.D.   On: 09/30/2017 05:42   Dg Knee Complete 4 Views Right  Result Date: 09/30/2017 CLINICAL DATA:  Initial evaluation for acute pain, swelling, redness, cellulitis about right knee. Recent abscess at right knee. EXAM: RIGHT KNEE - COMPLETE 4+ VIEW COMPARISON:  None. FINDINGS: No acute fracture or dislocation. No joint effusion. Soft tissue swelling involving the prepatellar and infrapatellar soft tissues anteriorly. No other acute soft tissue abnormality. No soft tissue emphysema. Joint spaces maintained without evidence for  significant degenerative or erosive arthropathy. IMPRESSION: 1. Prominent soft tissue swelling involving the prepatellar and infrapatellar soft tissues anteriorly, which could reflect infection/cellulitis. No soft tissue emphysema or radiopaque foreign body. 2. No acute osseous abnormality about the right knee. Electronically Signed   By: Jeannine Boga M.D.   On: 09/30/2017 05:44    Review of Systems  Constitutional: Negative for chills, fever and weight loss.  HENT: Negative  for ear discharge, ear pain, hearing loss and tinnitus.   Eyes: Negative for blurred vision, double vision, photophobia and pain.  Respiratory: Negative for cough, sputum production and shortness of breath.   Cardiovascular: Negative for chest pain.  Gastrointestinal: Negative for abdominal pain, nausea and vomiting.  Genitourinary: Negative for dysuria, flank pain, frequency and urgency.  Musculoskeletal: Positive for joint pain (Bilateral knees). Negative for back pain, falls, myalgias and neck pain.  Neurological: Negative for dizziness, tingling, sensory change, focal weakness, loss of consciousness and headaches.  Endo/Heme/Allergies: Does not bruise/bleed easily.  Psychiatric/Behavioral: Negative for depression, memory loss and substance abuse. The patient is not nervous/anxious.    Blood pressure (!) 147/93, pulse (!) 104, temperature 98.1 F (36.7 C), resp. rate 18, SpO2 100 %. Physical Exam  Constitutional: He appears well-developed and well-nourished. No distress.  HENT:  Head: Normocephalic and atraumatic.  Eyes: Conjunctivae are normal. Right eye exhibits no discharge. Left eye exhibits no discharge. No scleral icterus.  Neck: Normal range of motion.  Cardiovascular: Normal rate and regular rhythm.  Respiratory: Effort normal. No respiratory distress.  Musculoskeletal:  RLE No traumatic wounds, ecchymosis, or rash  Mod knee effusion, punctate wound over infrapatellar bursa, mod TTP, painful but  robust AROM  No ankle effusion  Knee stable to varus/ valgus and anterior/posterior stress  Sens DPN, SPN, TN intact  Motor EHL, ext, flex, evers 5/5  DP 2+, PT 2+, No significant edema  LLE No traumatic wounds, ecchymosis, or rash  Edema, erythema posterolateral knee, small punctate lesion with purulent discharge, no obvious fluctuance, painful but robust AROM  No knee or ankle effusion  Knee stable to varus/ valgus and anterior/posterior stress  Sens DPN, SPN, TN intact  Motor EHL, ext, flex, evers 5/5  DP 2+, PT 2+, No significant edema  Neurological: He is alert.  Skin: Skin is warm and dry. He is not diaphoretic.  Psychiatric: He has a normal mood and affect. His behavior is normal.    Assessment/Plan: Right infrapatellar septic bursitis -- Plan I&D tomorrow with Dr. Percell Miller. NPO after MN. Left popliteal abscess -- Plan I&D tomorrow    Lisette Abu, PA-C Orthopedic Surgery [phone] 09/30/2017, 9:18 AM

## 2017-09-30 NOTE — ED Provider Notes (Signed)
[facility] EMERGENCY DEPARTMENT Provider Note   CSN: [id number] Arrival date & time: 09/30/17  0145     History   Chief Complaint Chief Complaint  Patient presents with  . Leg Pain  . Abscess    HPI Cameron Preston is a 32 y.o. male.  The history is provided by the patient and medical records.     32 year old male with history of seizure disorder, presenting to the ED with bilateral leg swelling and redness.  Patient states 2 weeks ago he developed small wound over anterior right knee.  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  States he noticed increased pain when putting pressure on his knee while kneeling.  States shortly after that developed a "pimple" like appearance, friend was dressing at home and popped it with small amount of purulent drainage.  Got a lot worse after going to the pool recently.  States he went to [facility] and was evaluated 5 days ago-- was told there was nothing wrong and was discharged home without any medications.  States Sunday morning he woke up and felt something "biting him" on his left posterior knee.  States he never saw it but recently moved into new house and has been having issues with hornets and spiders.  States that area has also started to swell and become more red in color as well.  Reports fevers at home up to 101.32F.  Did take tylenol/motrin.  Reports hx of MRSA in the past.  Past Medical History:  Diagnosis Date  . Bowel obstruction ([facility])   . Seizures ([facility])     There are no active problems to display for this patient.   Past Surgical History:  Procedure Laterality Date  . ABDOMINAL SURGERY    . APPENDECTOMY    . FRACTURE SURGERY     hand repair        Home Medications    Prior to Admission medications   Medication Sig Start Date End Date Taking? Authorizing Provider  dicyclomine (BENTYL) 20 MG tablet Take 1 tablet (20 mg total) by mouth 3 (three) times daily as  needed for spasms. 02/14/16   Alvira Monday, MD  ibuprofen (ADVIL,MOTRIN) 200 MG tablet Take 200 mg by mouth every 6 (six) hours as needed.    [provider]  ondansetron (ZOFRAN ODT) 4 MG disintegrating tablet Take 1 tablet (4 mg total) by mouth every 8 (eight) hours as needed for nausea or vomiting. 02/14/16   Alvira Monday, MD  oxyCODONE (ROXICODONE) 5 MG immediate release tablet Take 1 tablet (5 mg total) by mouth every 4 (four) hours as needed for severe pain. 08/27/14   Ames Dura, MD    Family History No family history on file.  Social History Social History   Tobacco Use  . Smoking status: Current Some Day Smoker    Packs/day: 0.50  . Smokeless tobacco: Never Used  Substance Use Topics  . Alcohol use: No    Comment: o  . Drug use: Yes    Types: Marijuana     Allergies   Klonopin [clonazepam]; Valium [diazepam]; Xanax [alprazolam]; and Tylenol [acetaminophen]   Review of Systems Review of Systems  Cardiovascular: Positive for leg swelling.  Skin: Positive for color change and wound.  All other systems reviewed and are negative.    Physical Exam Updated Vital Signs BP (!) 157/109   Pulse (!) 134   Temp 98.4 F (36.9 C)  Resp 18   SpO2 100%   Physical Exam  Constitutional: He is oriented to person, place, and time. He appears well-developed and well-nourished.  HENT:  Head: Normocephalic and atraumatic.  Mouth/Throat: Oropharynx is clear and moist.  Eyes: Pupils are equal, round, and reactive to light. Conjunctivae and EOM are normal.  Neck: Normal range of motion.  Cardiovascular: Normal rate, regular rhythm and normal heart sounds.  Pulmonary/Chest: Effort normal and breath sounds normal.  Abdominal: Soft. Bowel sounds are normal.  Musculoskeletal: Normal range of motion.  Wound noted to right anterior knee without fluctuance, drainage, or bleeding; no apparent effusion; has surrounding erythema and induration that is nearly  circumferential around the proxima calf; areas of lymphangitis extending up the right medial thigh and down towards the right ankle; warmth to touch present  Left knee with area of bite in the popliteal fossa; surrounding induration present extending down the calf; area is warm to touch  Both extremities neurovascularly intact without tissue crepitus or signs of necrosis; no calf tenderness or palpable cords; limited ROM of the knees due to pain/swelling  Neurological: He is alert and oriented to person, place, and time.  Skin: Skin is warm and dry.  Psychiatric: He has a normal mood and affect.  Nursing note and vitals reviewed.    ED Treatments / Results  Labs (all labs ordered are listed, but only abnormal results are displayed) Labs Reviewed  COMPREHENSIVE METABOLIC PANEL - Abnormal; Notable for the following components:      Result Value   Glucose, Bld 123 (*)    All other components within normal limits  CBC WITH DIFFERENTIAL/PLATELET - Abnormal; Notable for the following components:   WBC 18.5 (*)    Neutro Abs 14.7 (*)    Monocytes Absolute 1.6 (*)    All other components within normal limits  CULTURE, BLOOD (ROUTINE X 2)  CULTURE, BLOOD (ROUTINE X 2)  VALPROIC ACID LEVEL  I-STAT CG4 LACTIC ACID, ED    EKG None  Radiology Dg Knee Complete 4 Views Left  Result Date: 09/30/2017 CLINICAL DATA:  Initial evaluation for acute swelling, redness, cellulitis, recent abscess to right knee EXAM: LEFT KNEE - COMPLETE 4+ VIEW COMPARISON:  None. FINDINGS: No acute fracture or dislocation. No significant joint effusion. Pellegrini-Stieda lesion present at the medial femoral condyle, suggesting prior/remote avulsion injury involving the medial collateral ligament. Joint spaces maintained without evidence for significant degenerative or erosive arthropathy. No appreciable soft tissue swelling or other abnormality. IMPRESSION: 1. No acute soft tissue abnormality identified about the knee.  2. No acute osseous abnormality about the left knee. 3. Pellegrini-Stieda lesion at the medial femoral condyle, suggesting prior medial collateral ligament injury. Electronically Signed   By: Rise Mu M.D.   On: 09/30/2017 05:42   Dg Knee Complete 4 Views Right  Result Date: 09/30/2017 CLINICAL DATA:  Initial evaluation for acute pain, swelling, redness, cellulitis about right knee. Recent abscess at right knee. EXAM: RIGHT KNEE - COMPLETE 4+ VIEW COMPARISON:  None. FINDINGS: No acute fracture or dislocation. No joint effusion. Soft tissue swelling involving the prepatellar and infrapatellar soft tissues anteriorly. No other acute soft tissue abnormality. No soft tissue emphysema. Joint spaces maintained without evidence for significant degenerative or erosive arthropathy. IMPRESSION: 1. Prominent soft tissue swelling involving the prepatellar and infrapatellar soft tissues anteriorly, which could reflect infection/cellulitis. No soft tissue emphysema or radiopaque foreign body. 2. No acute osseous abnormality about the right knee. Electronically Signed   By: Rise Mu  M.D.   On: 09/30/2017 05:44    Procedures Procedures (including critical care time)  Medications Ordered in ED Medications - No data to display   Initial Impression / Assessment and Plan / ED Course  I have reviewed the triage vital signs and the nursing notes.  Pertinent labs & imaging results that were available during my care of the patient were reviewed by me and considered in my medical decision making (see chart for details).  32 year old male presenting to the ED with cellulitis of both legs, right worse than left.  Wound on right knee for the past 2 weeks, likely work-related.  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  Seen at [facility] and no treatment initiated.  Now has worsening cellulitis of the right anterior knee extending into the medial thigh and lower leg.  Also has  new cellulitis of the left popliteal area secondary to a bug bite that occurred 3 days ago.  No evidence of tissue necrosis, crepitus, or joint effusion.  No signs of DVT on exam.  Patient has been febrile at home, nontoxic in appearance here.  Labs with leukocytosis of 18,000.  X-rays without signs of significant effusion or gas in the joints.  Suspect cellulitis, do not clinically suspect septic joint.  Patient was started on broad-spectrum vancomycin and Zosyn.  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  Will admit for ongoing care.  Discussed with Dr. Antionette Char-- very close to shift change, morning team to admit.  Patient remains stable at this time.  Final Clinical Impressions(s) / ED Diagnoses   Final diagnoses:  Cellulitis of right lower extremity  Cellulitis of left lower extremity    ED Discharge Orders    None       Garlon Hatchet, PA-C 09/30/17 1610    Shon Baton, MD 09/30/17 [phone]

## 2017-09-30 NOTE — ED Notes (Signed)
Attempted report x1. 

## 2017-09-30 NOTE — Progress Notes (Signed)
Cameron Preston [id number]  Code Status: FULL Admission Data: 09/30/2017 7:56 PM  Attending Provider: Opyd  [id number], No Pcp Per  Consults/ Treatment Team:   Cameron Preston is a 32 y.o. male patient admitted from ED awake, alert - oriented X 4 - no acute distress noted. VSS - Blood pressure 138/75, pulse (!) 121, temperature 98.7 F (37.1 C), temperature source Oral, resp. rate 16, SpO2 100 %. no c/o shortness of breath, no c/o chest pain. Orientation to room, and floor completed with information packet given to patient/family. Admission INP armband ID verified with patient/family, and in place.  SR up x 2, fall assessment complete, with patient and family able to verbalize understanding of risk associated with falls, and verbalized understanding to call nsg before up out of bed. Call light within reach, patient able to voice, and demonstrate understanding. Skin, clean-dry.  No evidence of skin break down noted on exam.  ?  Will cont to eval and treat per MD orders.  Jon Gills, RN  09/30/2017 7:56 PM

## 2017-09-30 NOTE — H&P (Signed)
History and Physical    Cameron Preston [id number]:[id number] DOB: [birth year] DOA: 09/30/2017  PCP:  [facility] [place] Consultants:  None Patient coming from:  Home - lives alone; NOK: He doesn't have anyone - has had a lot of friend and family deaths this year  Chief Complaint: Cellulitis  HPI: Cameron Preston is a 32 y.o. male with medical history significant of seizure d/o and SBO presenting with cellulitis.  His right knee and lower leg.  About 2 1/2 weeks ago, he had a bump on his leg.  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  A week later (1 week ago), he went to the pool and then his leg started burning like crazy.  His friend squeezed it to try to release the infection.  The next day, he couldn't get out of bed or walk.  That Sunday AM, he felt something biting him on the back of the knee - he didn't find any spiders or insects.  The next day it was hard to move at all and he fell.  He went to [facility] Monday by ambulance.  He was not given antibiotics, no US done.  Tuesday, it wasn't much better but that night it got bad.  He took his Adderall and Depakote about 7pm and then about 830 his leg started hurting really bad.  He took Ibuprofen and Tylenol.  The leg was hot.  He feel asleep and woke up at 1130pm with fever.  About 1245 he was able to get a message on FB asking for help.  He was vomiting.  A friend came to check on his and his temp was 101.2 and he was brought to the ER.   ED Course:  Carryover.  As per ER PA Sanders:  32 year old male presenting to the ED with cellulitis of both legs, right worse than left. Wound on right knee for the past 2 weeks, likely work-related. 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 Seen at [facility] and no treatment initiated. Now has worsening cellulitis of the right anterior knee extending into the medial thigh and lower leg. Also has new cellulitis of the left popliteal area  secondary to a bug bite that occurred 3 days ago. No evidence of tissue necrosis, crepitus, or joint effusion. No signs of DVT on exam. Patient has been febrile at home, nontoxic in appearance here. Labs with leukocytosis of 18,000. X-rays without signs of significant effusion or gas in the joints. Suspect cellulitis, do not clinically suspect septic joint. Patient was started on broad-spectrum vancomycin and Zosyn. 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 Will admit for ongoing care.  Discussed with Dr. Antionette Char-- very close to shift change, morning team to admit. Patient remains stable at this time.  Review of Systems: As per HPI; otherwise review of systems reviewed and negative.   Ambulatory Status:  Ambulates without assistance  Past Medical History:  Diagnosis Date  . ADHD   . Bowel obstruction (HCC)   . Myocardial infarction (HCC) 05/2017   reports "I had a heart attack" - left the hospital AMA but has followed up with cardiology  . Seizures (HCC)    last seizure was Saturday; he is unable to afford medication and so has been noncompliant    Past Surgical History:  Procedure Laterality Date  . ABDOMINAL SURGERY    . APPENDECTOMY    . FRACTURE SURGERY  hand repair    Social History   Socioeconomic History  . Marital status: Single    Spouse name: Not on file  . Number of children: Not on file  . Years of education: Not on file  . Highest education level: Not on file  Occupational History  . Occupation: welder  Social Needs  . Financial resource strain: Not on file  . Food insecurity:    Worry: Not on file    Inability: Not on file  . Transportation needs:    Medical: Not on file    Non-medical: Not on file  Tobacco Use  . Smoking status: Current Every Day Smoker    Packs/day: 0.50    Years: 17.00    Pack years: 8.50  . Smokeless tobacco: Never Used  Substance and Sexual Activity  . Alcohol use: No  . Drug use: Yes    Types: Marijuana    Comment: to keep his seizures  controlled  . Sexual activity: Not on file  Lifestyle  . Physical activity:    Days per week: Not on file    Minutes per session: Not on file  . Stress: Not on file  Relationships  . Social connections:    Talks on phone: Not on file    Gets together: Not on file    Attends religious service: Not on file    Active member of club or organization: Not on file    Attends meetings of clubs or organizations: Not on file    Relationship status: Not on file  . Intimate partner violence:    Fear of current or ex partner: Not on file    Emotionally abused: Not on file    Physically abused: Not on file    Forced sexual activity: Not on file  Other Topics Concern  . Not on file  Social History Narrative  . Not on file    Allergies  Allergen Reactions  . Klonopin [Clonazepam] Shortness Of Breath and Swelling    Throat swelling and tightness  . Valium [Diazepam] Other (See Comments)    Altered mental status, syncope and confusion  . Xanax [Alprazolam] Shortness Of Breath and Swelling    Throat swelling and tightness  . Tylenol [Acetaminophen] Other (See Comments)    Sneezing, causing stomach problems    Family History  Problem Relation Age of Onset  . Chronic infections Mother   . Other Sister        MVC    Prior to Admission medications   Medication Sig Start Date End Date Taking? Authorizing Provider  amphetamine-dextroamphetamine (ADDERALL) 15 MG tablet Take 15 mg by mouth 2 (two) times daily.   Yes [provider]  Cyanocobalamin (VITAMIN B-12 PO) Take 1 tablet by mouth daily.   Yes [provider]  divalproex (DEPAKOTE) 250 MG DR tablet Take 250 mg by mouth 2 (two) times daily.   Yes [provider]  GARLIC PO Take 1 tablet by mouth 3 (three) times a week.   Yes [provider]  ibuprofen (ADVIL,MOTRIN) 200 MG tablet Take 200 mg by mouth every 6 (six) hours as needed for moderate pain.    Yes [provider]  Pediatric Multiple  Vit-C-FA (FLINSTONES GUMMIES OMEGA-3 DHA PO) Take 1-2 tablets by mouth daily.   Yes [provider]  ranitidine (ZANTAC) 150 MG tablet Take 75 mg by mouth daily.   Yes [provider]  dicyclomine (BENTYL) 20 MG tablet Take 1 tablet (20 mg total)  by mouth 3 (three) times daily as needed for spasms. Patient not taking: Reported on 09/30/2017 02/14/16   Alvira Monday, MD  ondansetron (ZOFRAN ODT) 4 MG disintegrating tablet Take 1 tablet (4 mg total) by mouth every 8 (eight) hours as needed for nausea or vomiting. Patient not taking: Reported on 09/30/2017 02/14/16   Alvira Monday, MD  oxyCODONE (ROXICODONE) 5 MG immediate release tablet Take 1 tablet (5 mg total) by mouth every 4 (four) hours as needed for severe pain. Patient not taking: Reported on 09/30/2017 08/27/14   Ames Dura, MD    Physical Exam: Vitals:   09/30/17 0616 09/30/17 0645 09/30/17 0700 09/30/17 0804  BP: (!) 154/93 (!) 149/91 (!) 164/91 (!) 147/93  Pulse: (!) 112 (!) 114 (!) 114 (!) 104  Resp: 18 (!) 29 (!) 24 18  Temp:    98.1 F (36.7 C)  SpO2: 92% 95% 100% 100%     General:  Appears calm and comfortable and is NAD; he is quite conversant Eyes:  PERRL, EOMI, normal lids, iris ENT:  grossly normal hearing, lips & tongue, mmm; appropriate dentition Neck:  no LAD, masses or thyromegaly Cardiovascular:  Tachycardia, no m/r/g. No LE edema.  Respiratory:   CTA bilaterally with no wheezes/rales/rhonchi.  Normal respiratory effort. Abdomen:  soft, NT, ND, NABS Back:   normal alignment, no CVAT Skin: right prepatellar abscess; skin wound is about 2 cm with surrounding erythema of most of the lower leg and marked calf and thigh edema.  There is a second roughly 2 cm lesion in the popliteal fossa of the left leg with milder surrounding erythema and much less edema.         Musculoskeletal:  grossly normal tone BUE/BLE, good ROM, no bony abnormality Psychiatric:  hyperactive mood and affect, speech  fluent and appropriate but somewhat pressured and tangential, AOx3 Neurologic:  CN 2-12 grossly intact, moves all extremities in coordinated fashion, sensation intact    Radiological Exams on Admission: Dg Knee Complete 4 Views Left  Result Date: 09/30/2017 CLINICAL DATA:  Initial evaluation for acute swelling, redness, cellulitis, recent abscess to right knee EXAM: LEFT KNEE - COMPLETE 4+ VIEW COMPARISON:  None. FINDINGS: No acute fracture or dislocation. No significant joint effusion. Pellegrini-Stieda lesion present at the medial femoral condyle, suggesting prior/remote avulsion injury involving the medial collateral ligament. Joint spaces maintained without evidence for significant degenerative or erosive arthropathy. No appreciable soft tissue swelling or other abnormality. IMPRESSION: 1. No acute soft tissue abnormality identified about the knee. 2. No acute osseous abnormality about the left knee. 3. Pellegrini-Stieda lesion at the medial femoral condyle, suggesting prior medial collateral ligament injury. Electronically Signed   By: Rise Mu M.D.   On: 09/30/2017 05:42   Dg Knee Complete 4 Views Right  Result Date: 09/30/2017 CLINICAL DATA:  Initial evaluation for acute pain, swelling, redness, cellulitis about right knee. Recent abscess at right knee. EXAM: RIGHT KNEE - COMPLETE 4+ VIEW COMPARISON:  None. FINDINGS: No acute fracture or dislocation. No joint effusion. Soft tissue swelling involving the prepatellar and infrapatellar soft tissues anteriorly. No other acute soft tissue abnormality. No soft tissue emphysema. Joint spaces maintained without evidence for significant degenerative or erosive arthropathy. IMPRESSION: 1. Prominent soft tissue swelling involving the prepatellar and infrapatellar soft tissues anteriorly, which could reflect infection/cellulitis. No soft tissue emphysema or radiopaque foreign body. 2. No acute osseous abnormality about the right knee. Electronically  Signed   By: Rise Mu M.D.   On: 09/30/2017 05:44  EKG: not done   Labs on Admission: I have personally reviewed the available labs and imaging studies at the time of the admission.  Pertinent labs:   Glucose 123 CMP otherwise WNL WBC 18.5 CBC otherwise WNL Depakote <10 Blood cultures pending  Assessment/Plan Principal Problem:   Sepsis (HCC) Active Problems:   Cellulitis and abscess of leg   Seizure disorder (HCC)   ADHD   Tobacco dependence   Marijuana abuse   Sepsis due to B leg cellulitis with abscesses --SIRS criteria in this patient includes: Leukocytosis, tachycardia, tachypnea  -Patient does not have evidence of acute organ failure at this time -While awaiting blood cultures, this may be a preseptic condition. -Sepsis protocol initiated -Patient with reported h/o MRSA in the past -He appears to have right prepatellar abscess but there is concern for DVT given the extent of the edema throughout the lower leg and thigh; he also has a popliteal infection on the left concerning for septic bursitis -Orthopedics has been consulted for assistance with further imaging as well as possible need for I&D -He was given Vanc and Zosyn in the ER; will continue for now -Blood cultures pending -Given the progression of the disease, HIV testing and UDS have been ordered -Will place in observation status on Med Surg for now and continue to monitor; if he is not showing marked improvement in the next 24-48 hours, he will likely need to be changed to inpatient status  Seizure disorder -Patient reports having ongoing seizures -Assuming this is the case, he should not be driving for at least 6 months -This is almost certainly related to noncompliance with his seizure medication - his depakote level is undetectable and he acknowledges that he has not been taking it regularly -Counseling about the importance of ongoing use of this medication should be provided  ADHD -Hold  medications - he will not be performing tasks that require attention and focus as an inpatient  Tobacco dependence -Encourage cessation.  This was discussed with the patient and should be reviewed on an ongoing basis.   -Patch ordered at patient request.  Marijuana abuse -Cessation encouraged; this should be encouraged on an ongoing basis -In general, marijuana is not well known as an antiepileptic agent -UDS ordered    DVT prophylaxis: Lovenox Code Status:  Full Family Communication: None present  Disposition Plan:  Home once clinically improved Consults called: Orthopedics  Admission status: It is my clinical opinion that referral for OBSERVATION is reasonable and necessary in this patient based on the above information provided. The aforementioned taken together are felt to place the patient at high risk for further clinical deterioration. However it is anticipated that the patient may be medically stable for discharge from the hospital within 24 to 48 hours.    Jonah Blue MD [facility]  If note is complete, please contact covering daytime or nighttime physician. [email or web address] Password [email or web address]  09/30/2017, 11:22 AM

## 2017-09-30 NOTE — ED Triage Notes (Signed)
Pt reports that 2 weeks ago he had a abscess to his right knee, he had gone to the pool and experienced burning pain and drainage at the area. Pt continued to dress the wound but started having increasing pain, redness, and swelling. Pt was seen at [facility] and sent home. Pt reports that Sunday morning he woke up to something biting him on the back of his left knee and began having swelling, pain and drainage. Bilateral legs have areas of redness, swelling and hot to the touch. Pt states he took ibuprofen and Tylenol at 0030 and had a fever of 101.5.

## 2017-09-30 NOTE — Progress Notes (Signed)
Pharmacy Antibiotic Note  Cameron Preston is a 32 y.o. male admitted on 09/30/2017 with cellulitis.  Pharmacy has been consulted for Vancomycin and Zosyn  dosing.  Plan: Vancomycin 750 mg IV q8h Zosyn 3.375 g IV q8h      Temp (24hrs), Avg:98.3 F (36.8 C), Min:98.1 F (36.7 C), Max:98.4 F (36.9 C)  Recent Labs  Lab 09/30/17 0227 09/30/17 0234  WBC 18.5*  --   CREATININE 0.72  --   LATICACIDVEN  --  0.99    CrCl cannot be calculated (Unknown ideal weight.).    Allergies  Allergen Reactions  . Klonopin [Clonazepam] Shortness Of Breath and Swelling    Throat swelling and tightness  . Valium [Diazepam] Other (See Comments)    Altered mental status, syncope and confusion  . Xanax [Alprazolam] Shortness Of Breath and Swelling    Throat swelling and tightness  . Tylenol [Acetaminophen] Other (See Comments)    Sneezing, causing stomach problems    Eddie Candle 09/30/2017 8:27 AM

## 2017-09-30 NOTE — ED Notes (Signed)
Report accepted by Misty Stanley, RN

## 2017-10-01 ENCOUNTER — Observation Stay (HOSPITAL_COMMUNITY): Payer: Self-pay | Admitting: Certified Registered Nurse Anesthetist

## 2017-10-01 ENCOUNTER — Encounter (HOSPITAL_COMMUNITY)
Admission: EM | Disposition: A | Discharge status: Home / Self Care | Payer: Self-pay | Source: Home / Self Care | Attending: Family Medicine

## 2017-10-01 ENCOUNTER — Encounter (HOSPITAL_COMMUNITY): Payer: Self-pay

## 2017-10-01 ENCOUNTER — Encounter (HOSPITAL_COMMUNITY): Payer: Self-pay | Admitting: Anesthesiology

## 2017-10-01 DIAGNOSIS — L03115 Cellulitis of right lower limb: Secondary | ICD-10-CM | POA: Diagnosis present

## 2017-10-01 HISTORY — PX: I & D EXTREMITY: SHX5045

## 2017-10-01 LAB — HIV ANTIBODY (ROUTINE TESTING W REFLEX): HIV SCREEN 4TH GENERATION: NONREACTIVE

## 2017-10-01 SURGERY — IRRIGATION AND DEBRIDEMENT EXTREMITY
Anesthesia: General | Site: Knee | Laterality: Bilateral

## 2017-10-01 MED ORDER — DEXAMETHASONE SODIUM PHOSPHATE 10 MG/ML IJ SOLN
INTRAMUSCULAR | Status: DC | PRN
  Administered 2017-10-01: 10 mg via INTRAVENOUS

## 2017-10-01 MED ORDER — VANCOMYCIN HCL IN DEXTROSE 1-5 GM/200ML-% IV SOLN
1000.0000 mg | Freq: Two times a day (BID) | INTRAVENOUS | Status: DC
  Administered 2017-10-01 – 2017-10-03 (×5): 1000 mg via INTRAVENOUS
  Filled 2017-10-01 (×5): qty 200

## 2017-10-01 MED ORDER — OXYCODONE HCL 5 MG/5ML PO SOLN: 5.0000 mg | Freq: Once | ORAL | Status: AC | PRN

## 2017-10-01 MED ORDER — OXYCODONE HCL 5 MG PO TABS
5.0000 mg | ORAL_TABLET | Freq: Once | ORAL | Status: AC | PRN
  Administered 2017-10-01: 5 mg via ORAL

## 2017-10-01 MED ORDER — ONDANSETRON HCL 4 MG PO TABS: 4.0000 mg | ORAL_TABLET | Freq: Four times a day (QID) | ORAL | Status: DC | PRN

## 2017-10-01 MED ORDER — OXYCODONE HCL 5 MG PO TABS
5.0000 mg | ORAL_TABLET | ORAL | Status: DC | PRN
  Administered 2017-10-01 – 2017-10-04 (×7): 5 mg via ORAL
  Filled 2017-10-01 (×8): qty 1

## 2017-10-01 MED ORDER — AMPHETAMINE-DEXTROAMPHETAMINE 15 MG PO TABS: 15.0000 mg | ORAL_TABLET | Freq: Two times a day (BID) | ORAL | Status: DC

## 2017-10-01 MED ORDER — DIVALPROEX SODIUM 250 MG PO DR TAB
250.0000 mg | DELAYED_RELEASE_TABLET | Freq: Two times a day (BID) | ORAL | Status: DC
  Administered 2017-10-01 – 2017-10-06 (×11): 250 mg via ORAL
  Filled 2017-10-01 (×11): qty 1

## 2017-10-01 MED ORDER — ONDANSETRON HCL 4 MG/2ML IJ SOLN
INTRAMUSCULAR | Status: AC
  Filled 2017-10-01: qty 2

## 2017-10-01 MED ORDER — DEXAMETHASONE SODIUM PHOSPHATE 10 MG/ML IJ SOLN
INTRAMUSCULAR | Status: AC
  Filled 2017-10-01: qty 1

## 2017-10-01 MED ORDER — ONDANSETRON HCL 4 MG/2ML IJ SOLN
4.0000 mg | Freq: Four times a day (QID) | INTRAMUSCULAR | Status: DC | PRN
  Administered 2017-10-04: 4 mg via INTRAVENOUS
  Filled 2017-10-01 (×2): qty 2

## 2017-10-01 MED ORDER — MEPERIDINE HCL 50 MG/ML IJ SOLN: 6.2500 mg | INTRAMUSCULAR | Status: DC | PRN

## 2017-10-01 MED ORDER — METOCLOPRAMIDE HCL 10 MG PO TABS: 5.0000 mg | ORAL_TABLET | Freq: Three times a day (TID) | ORAL | Status: DC | PRN

## 2017-10-01 MED ORDER — ONDANSETRON HCL 4 MG/2ML IJ SOLN
INTRAMUSCULAR | Status: DC | PRN
  Administered 2017-10-01: 4 mg via INTRAVENOUS

## 2017-10-01 MED ORDER — METOCLOPRAMIDE HCL 5 MG/ML IJ SOLN: 5.0000 mg | Freq: Three times a day (TID) | INTRAMUSCULAR | Status: DC | PRN

## 2017-10-01 MED ORDER — CEFAZOLIN SODIUM-DEXTROSE 2-4 GM/100ML-% IV SOLN
2.0000 g | Freq: Four times a day (QID) | INTRAVENOUS | Status: AC
  Administered 2017-10-01 – 2017-10-02 (×3): 2 g via INTRAVENOUS
  Filled 2017-10-01 (×3): qty 100

## 2017-10-01 MED ORDER — PROPOFOL 10 MG/ML IV BOLUS
INTRAVENOUS | Status: DC | PRN
  Administered 2017-10-01: 150 mg via INTRAVENOUS

## 2017-10-01 MED ORDER — FENTANYL CITRATE (PF) 100 MCG/2ML IJ SOLN
INTRAMUSCULAR | Status: AC
  Filled 2017-10-01: qty 2

## 2017-10-01 MED ORDER — NICOTINE 14 MG/24HR TD PT24
14.0000 mg | MEDICATED_PATCH | Freq: Every day | TRANSDERMAL | Status: DC
  Administered 2017-10-01 – 2017-10-06 (×6): 14 mg via TRANSDERMAL
  Filled 2017-10-01 (×6): qty 1

## 2017-10-01 MED ORDER — PROPOFOL 10 MG/ML IV BOLUS
INTRAVENOUS | Status: AC
  Filled 2017-10-01: qty 20

## 2017-10-01 MED ORDER — FENTANYL CITRATE (PF) 100 MCG/2ML IJ SOLN: 25.0000 ug | INTRAMUSCULAR | Status: DC | PRN

## 2017-10-01 MED ORDER — DEXMEDETOMIDINE HCL 200 MCG/2ML IV SOLN
INTRAVENOUS | Status: DC | PRN
  Administered 2017-10-01 (×5): 4 ug via INTRAVENOUS

## 2017-10-01 MED ORDER — DOCUSATE SODIUM 100 MG PO CAPS
100.0000 mg | ORAL_CAPSULE | Freq: Two times a day (BID) | ORAL | Status: DC
  Administered 2017-10-01 – 2017-10-06 (×9): 100 mg via ORAL
  Filled 2017-10-01 (×11): qty 1

## 2017-10-01 MED ORDER — ACETAMINOPHEN 325 MG PO TABS
650.0000 mg | ORAL_TABLET | Freq: Four times a day (QID) | ORAL | Status: DC | PRN
  Administered 2017-10-04 – 2017-10-06 (×2): 650 mg via ORAL
  Filled 2017-10-01 (×3): qty 2

## 2017-10-01 MED ORDER — 0.9 % SODIUM CHLORIDE (POUR BTL) OPTIME
TOPICAL | Status: DC | PRN
  Administered 2017-10-01 (×2): 1000 mL

## 2017-10-01 MED ORDER — LIDOCAINE 2% (20 MG/ML) 5 ML SYRINGE
INTRAMUSCULAR | Status: AC
  Filled 2017-10-01: qty 5

## 2017-10-01 MED ORDER — FENTANYL CITRATE (PF) 250 MCG/5ML IJ SOLN
INTRAMUSCULAR | Status: DC | PRN
  Administered 2017-10-01 (×5): 50 ug via INTRAVENOUS

## 2017-10-01 MED ORDER — OXYCODONE HCL 5 MG PO TABS
ORAL_TABLET | ORAL | Status: AC
  Filled 2017-10-01: qty 1

## 2017-10-01 MED ORDER — LIDOCAINE 2% (20 MG/ML) 5 ML SYRINGE
INTRAMUSCULAR | Status: DC | PRN
  Administered 2017-10-01: 60 mg via INTRAVENOUS

## 2017-10-01 MED ORDER — MORPHINE SULFATE (PF) 4 MG/ML IV SOLN
4.0000 mg | INTRAVENOUS | Status: DC | PRN
  Administered 2017-10-01 – 2017-10-04 (×8): 4 mg via INTRAVENOUS
  Filled 2017-10-01 (×8): qty 1

## 2017-10-01 MED ORDER — FENTANYL CITRATE (PF) 250 MCG/5ML IJ SOLN
INTRAMUSCULAR | Status: AC
  Filled 2017-10-01: qty 5

## 2017-10-01 SURGICAL SUPPLY — 59 items
BANDAGE ACE 4X5 VEL STRL LF (GAUZE/BANDAGES/DRESSINGS) ×3 IMPLANT
BANDAGE ACE 6X5 VEL STRL LF (GAUZE/BANDAGES/DRESSINGS) ×3 IMPLANT
BANDAGE ELASTIC 6 VELCRO ST LF (GAUZE/BANDAGES/DRESSINGS) ×2 IMPLANT
BANDAGE ESMARK 6X9 LF (GAUZE/BANDAGES/DRESSINGS) IMPLANT
BLADE SURG 10 STRL SS (BLADE) ×3 IMPLANT
BNDG CMPR 9X4 STRL LF SNTH (GAUZE/BANDAGES/DRESSINGS)
BNDG CMPR 9X6 STRL LF SNTH (GAUZE/BANDAGES/DRESSINGS)
BNDG COHESIVE 4X5 TAN STRL (GAUZE/BANDAGES/DRESSINGS) ×3 IMPLANT
BNDG ESMARK 4X9 LF (GAUZE/BANDAGES/DRESSINGS) IMPLANT
BNDG ESMARK 6X9 LF (GAUZE/BANDAGES/DRESSINGS)
BNDG GAUZE ELAST 4 BULKY (GAUZE/BANDAGES/DRESSINGS) ×3 IMPLANT
CONT SPEC 4OZ CLIKSEAL STRL BL (MISCELLANEOUS) IMPLANT
COVER SURGICAL LIGHT HANDLE (MISCELLANEOUS) ×3 IMPLANT
CUFF TOURN SGL LL 12 NO SLV (MISCELLANEOUS) IMPLANT
CUFF TOURNIQUET SINGLE 34IN LL (TOURNIQUET CUFF) IMPLANT
DRAIN PENROSE 1/4X12 LTX STRL (WOUND CARE) ×2 IMPLANT
DRAPE EXTREMITY BILATERAL (DRAPES) ×2 IMPLANT
DRAPE ORTHO SPLIT 77X108 STRL (DRAPES) ×3
DRAPE SURG 17X23 STRL (DRAPES) IMPLANT
DRAPE SURG ORHT 6 SPLT 77X108 (DRAPES) IMPLANT
DRAPE U-SHAPE 47X51 STRL (DRAPES) IMPLANT
DRSG PAD ABDOMINAL 8X10 ST (GAUZE/BANDAGES/DRESSINGS) ×3 IMPLANT
DURAPREP 26ML APPLICATOR (WOUND CARE) ×5 IMPLANT
ELECT REM PT RETURN 9FT ADLT (ELECTROSURGICAL)
ELECTRODE REM PT RTRN 9FT ADLT (ELECTROSURGICAL) IMPLANT
EVACUATOR 1/8 PVC DRAIN (DRAIN) IMPLANT
FACESHIELD WRAPAROUND (MASK) IMPLANT
FACESHIELD WRAPAROUND OR TEAM (MASK) ×1 IMPLANT
GAUZE SPONGE 4X4 12PLY STRL (GAUZE/BANDAGES/DRESSINGS) ×3 IMPLANT
GAUZE SPONGE 4X4 12PLY STRL LF (GAUZE/BANDAGES/DRESSINGS) ×2 IMPLANT
GAUZE XEROFORM 1X8 LF (GAUZE/BANDAGES/DRESSINGS) ×3 IMPLANT
GLOVE BIO SURGEON STRL SZ7.5 (GLOVE) ×6 IMPLANT
GLOVE BIOGEL PI IND STRL 8 (GLOVE) ×2 IMPLANT
GLOVE BIOGEL PI INDICATOR 8 (GLOVE) ×4
GOWN STRL REUS W/ TWL LRG LVL3 (GOWN DISPOSABLE) ×3 IMPLANT
GOWN STRL REUS W/TWL LRG LVL3 (GOWN DISPOSABLE) ×9
HANDPIECE INTERPULSE COAX TIP (DISPOSABLE)
KIT BASIN OR (CUSTOM PROCEDURE TRAY) ×3 IMPLANT
KIT TURNOVER KIT B (KITS) ×3 IMPLANT
MANIFOLD NEPTUNE II (INSTRUMENTS) ×3 IMPLANT
NEEDLE 25GAX1.5 (MISCELLANEOUS) IMPLANT
NS IRRIG 1000ML POUR BTL (IV SOLUTION) ×3 IMPLANT
PACK ORTHO EXTREMITY (CUSTOM PROCEDURE TRAY) ×3 IMPLANT
PAD ARMBOARD 7.5X6 YLW CONV (MISCELLANEOUS) ×6 IMPLANT
SET HNDPC FAN SPRY TIP SCT (DISPOSABLE) IMPLANT
SPONGE LAP 18X18 X RAY DECT (DISPOSABLE) IMPLANT
STOCKINETTE IMPERVIOUS 9X36 MD (GAUZE/BANDAGES/DRESSINGS) ×3 IMPLANT
SUT ETHILON 3 0 FSL (SUTURE) ×4 IMPLANT
SUT ETHILON 3 0 PS 1 (SUTURE) IMPLANT
SUT PDS AB 2-0 CT1 27 (SUTURE) IMPLANT
SWAB CULTURE ESWAB REG 1ML (MISCELLANEOUS) IMPLANT
SYR CONTROL 10ML LL (SYRINGE) IMPLANT
TOWEL OR 17X24 6PK STRL BLUE (TOWEL DISPOSABLE) ×3 IMPLANT
TOWEL OR 17X26 10 PK STRL BLUE (TOWEL DISPOSABLE) ×3 IMPLANT
TUBE CONNECTING 12'X1/4 (SUCTIONS) ×1
TUBE CONNECTING 12X1/4 (SUCTIONS) ×2 IMPLANT
TUBING CYSTO DISP (UROLOGICAL SUPPLIES) IMPLANT
UNDERPAD 30X30 (UNDERPADS AND DIAPERS) ×3 IMPLANT
YANKAUER SUCT BULB TIP NO VENT (SUCTIONS) ×3 IMPLANT

## 2017-10-01 NOTE — Transfer of Care (Signed)
Immediate Anesthesia Transfer of Care Note  Patient: Cameron Preston  Procedure(s) Performed: IRRIGATION AND DEBRIDEMENT EXTREMITY (Bilateral Knee)  Patient Location: PACU  Anesthesia Type:General  Level of Consciousness: awake, alert  and oriented  Airway & Oxygen Therapy: Patient Spontanous Breathing and Patient connected to face mask oxygen  Post-op Assessment: Report given to RN and Post -op Vital signs reviewed and stable  Post vital signs: Reviewed and stable  Last Vitals:  Vitals Value Taken Time  BP 126/84 10/01/2017 12:41 PM  Temp    Pulse 91 10/01/2017 12:44 PM  Resp 15 10/01/2017 12:44 PM  SpO2 100 % 10/01/2017 12:44 PM  Vitals shown include unvalidated device data.  Last Pain:  Vitals:   10/01/17 0858  TempSrc:   PainSc: 4       Patients Stated Pain Goal: 4 (10/01/17 0758)  Complications: No apparent anesthesia complications

## 2017-10-01 NOTE — Progress Notes (Addendum)
  PROGRESS NOTE  DEWYANE ZUG EYC:[id number] DOB: July 16, 1985 DOA: 09/30/2017 PCP: Patient, No Pcp Per  Brief Narrative: 32 year old man presented with bilateral leg pain.  Admitted for bilateral leg infections/abscesses.  Assessment/Plan Right infrapatellar septic bursitis, left popliteal abscess.  Sepsis was initially considered but patient did not meet criteria as he had no evidence of endorgan dysfunction and lactic acid was within normal limits.  Status post bilateral incision and drainage 9/5 by orthopedics. --Follow-up culture data, screen HIV --given BLE edema and immobility at home will check venous u/s to r/o DVT  Seizure disorder, reportedly noncompliant with medication. --Depakote level was low.  Appetite is been restarted.  Patient should not drive for at least 6 months.  Needs close outpatient follow-up.  ADHD.  Hold medication while inpatient.  Marijuana abuse.  Recommend cessation.   She has had surgery, continues to have significant pain, needs close follow-up as an inpatient to monitor for improvement in infection.  DVT prophylaxis: early ambulation.  Code Status: Full Family Communication: none Disposition Plan: home    Brendia Sacks, MD  [facility] Direct contact: [phone] --Via amion app OR  --[email or web address]; password [email or web address]  7PM-7AM contact night coverage as above 10/01/2017, 3:34 PM  LOS: 0 days   Consultants:  Orthopedics   Procedures: IRRIGATION AND DEBRIDEMENT EXTREMITY BLE  Antimicrobials:  Vancomycin 9/4 >  Interval history/Subjective: Both legs hurt.  Objective: Vitals:  Vitals:   10/01/17 1337 10/01/17 1410  BP: 129/85 126/80  Pulse: (!) 101   Resp: 20 20  Temp: 97.8 F (36.6 C)   SpO2: 100% 100%    Exam:  Constitutional:  . Appears calm, uncomfortable but not toxic Respiratory:  . CTA bilaterally, no w/r/r.  . Respiratory effort normal.  Cardiovascular:  . RRR, no m/r/g Skin:  . Both legs  bandaged Psychiatric:  . Mental status o Mood, affect appropriate  I have personally reviewed the following:   Labs:  Complete metabolic panel unremarkable  WBC 18.5 on admission, no repeat blood work today  Imaging studies:  Knee x-rays noted  Scheduled Meds: . divalproex  250 mg Oral BID  . docusate sodium  100 mg Oral BID  . oxyCODONE       Continuous Infusions: .  ceFAZolin (ANCEF) IV      Principal Problem:   Cellulitis of right leg Active Problems:   Cellulitis and abscess of leg   Seizure disorder (HCC)   ADHD   Tobacco dependence   Marijuana abuse   LOS: 0 days   Time spent 35 minutes, >50% in discussion with patient, counseling, review of treatment plan.

## 2017-10-01 NOTE — Progress Notes (Addendum)
Patient back from OR. Alert and oriented x 4, complaining of pain BLE. VS stable. Compression wrap bilateral knees dry, clean, intact. Will continue to monitor.

## 2017-10-01 NOTE — Op Note (Signed)
10/01/2017  12:27 PM  PATIENT:  Cameron Preston    PRE-OPERATIVE DIAGNOSIS:  Bilateral knee infections  POST-OPERATIVE DIAGNOSIS:  Same  PROCEDURE:  IRRIGATION AND DEBRIDEMENT EXTREMITY  SURGEON:  Sheral Apley, MD  ASSISTANT: none  ANESTHESIA:   gen  PREOPERATIVE INDICATIONS:  Cameron Preston is a  32 y.o. male with a diagnosis of Bilateral knee infections who failed conservative measures and elected for surgical management.    The risks benefits and alternatives were discussed with the patient preoperatively including but not limited to the risks of infection, bleeding, nerve injury, cardiopulmonary complications, the need for revision surgery, among others, and the patient was willing to proceed.  OPERATIVE IMPLANTS: none  OPERATIVE FINDINGS: purulent fluid  BLOOD LOSS: 30  COMPLICATIONS: none  TOURNIQUET TIME: none  OPERATIVE PROCEDURE:  Patient was identified in the preoperative holding area and site was marked by me He was transported to the operating theater and placed on the table in supine position taking care to pad all bony prominences. After a preincinduction time out anesthesia was induced. The bilateral lower extremity was prepped and draped in normal sterile fashion and a pre-incision timeout was performed. He received ancef after cultures for preoperative antibiotics.   I started with his right lower extremity I made an oblique incision over his prepatellar bursa and palpable fluctuance medially.  I sent cutlures  I debrided necrotic deep tissue (fascia) with a rondure.  I performed a thorough irrigation  I then performed a complex closure of this 8 cm wound/incision.  Next I turned my attention to his left lower extremity he had a popliteal abscess I made a superficial incision over this extending his wound proximally and distally.  There is significant purulent fluid I sent this for cultures.  I debrided necrotic tissue(fascia/subcutaneous) with a  pickup.  This was deep tissue.  Next I performed a thorough irrigation confirm with opened all pockets of fluctuance.  I then placed a drain and closed his tissue loosely performing a complex closure.  Sterile dressings were applied to both extremities he was awoken taken to PACU in stable condition  POST OPERATIVE PLAN: WBAT ble, Iv abx per primary, dvt px per primary

## 2017-10-01 NOTE — Progress Notes (Addendum)
Pharmacy Antibiotic Note  Cameron Preston is a 32 y.o. male admitted on 09/30/2017 with cellulitis.  Pharmacy has been consulted to restart Vancomycin regimen. Pt was previously on Vancomycin 750 mg IV q8h and Zosyn 3.375 g IV q8h.   Pt height and weight are unknown, Scr was 0.72 mg/dL on 9/4.   Left Leg culture positive for Gram Positive Cocci. (9/5) Right Leg culture positive for Gram Negative Rods. (9/5)  Plan: Restart Vancomycin 1000 mg IV q12h F/u Scr, Cultures, and progress.  Vanc trough as needed.     Temp (24hrs), Avg:98.2 F (36.8 C), Min:97 F (36.1 C), Max:99.5 F (37.5 C)  Recent Labs  Lab 09/30/17 0227 09/30/17 0234  WBC 18.5*  --   CREATININE 0.72  --   LATICACIDVEN  --  0.99    CrCl cannot be calculated (Unknown ideal weight.).    Allergies  Allergen Reactions  . Klonopin [Clonazepam] Shortness Of Breath and Swelling    Throat swelling and tightness  . Valium [Diazepam] Other (See Comments)    Altered mental status, syncope and confusion  . Xanax [Alprazolam] Shortness Of Breath and Swelling    Throat swelling and tightness  . Tylenol [Acetaminophen] Other (See Comments)    Sneezing, causing stomach problems    Cameron Preston 10/01/2017 3:46 PM

## 2017-10-01 NOTE — Progress Notes (Signed)
Patient going to OR for I&D BLE. Alert and oriented; no acute distress at this time, no complaints.

## 2017-10-01 NOTE — Progress Notes (Signed)
MD was notified that patient doesn't have any pain medicine PRN and he is requested for pain level 8 BLE. Will continue to monitor.

## 2017-10-01 NOTE — Anesthesia Postprocedure Evaluation (Signed)
Anesthesia Post Note  Patient: Cameron Preston  Procedure(s) Performed: IRRIGATION AND DEBRIDEMENT EXTREMITY (Bilateral Knee)     Patient location during evaluation: PACU Anesthesia Type: General Level of consciousness: awake and alert Pain management: pain level controlled Vital Signs Assessment: post-procedure vital signs reviewed and stable Respiratory status: spontaneous breathing, nonlabored ventilation and respiratory function stable Cardiovascular status: blood pressure returned to baseline and stable Postop Assessment: no apparent nausea or vomiting Anesthetic complications: no    Last Vitals:  Vitals:   10/01/17 1302 10/01/17 1311  BP:  (!) 142/91  Pulse: 91 99  Resp: 14 20  Temp:  (!) 36.1 C  SpO2: 99% 98%    Last Pain:  Vitals:   10/01/17 1311  TempSrc:   PainSc: 3                  Hady Niemczyk,W. EDMOND

## 2017-10-01 NOTE — Anesthesia Preprocedure Evaluation (Addendum)
Anesthesia Evaluation  Patient identified by MRN, date of birth, ID band Patient awake    Reviewed: Allergy & Precautions, NPO status , Patient's Chart, lab work & pertinent test results  Airway Mallampati: I  TM Distance: >3 FB Neck ROM: Full    Dental  (+) Teeth Intact   Pulmonary Current Smoker,    Pulmonary exam normal breath sounds clear to auscultation       Cardiovascular + Past MI   Rhythm:Regular Rate:Normal  Reports MI last week - signed out AMA records not available   Neuro/Psych Seizures -, Poorly Controlled,  PSYCHIATRIC DISORDERS ADHDNon compliant    GI/Hepatic negative GI ROS, (+)     substance abuse  marijuana use,   Endo/Other  negative endocrine ROS  Renal/GU   negative genitourinary   Musculoskeletal Bilateral knee infections   Abdominal   Peds  Hematology negative hematology ROS (+)   Anesthesia Other Findings   Reproductive/Obstetrics                            Anesthesia Physical Anesthesia Plan  ASA: II  Anesthesia Plan: General   Post-op Pain Management:    Induction: Intravenous  PONV Risk Score and Plan: Midazolam, Ondansetron, Dexamethasone and Treatment may vary due to age or medical condition  Airway Management Planned: LMA  Additional Equipment:   Intra-op Plan:   Post-operative Plan: Extubation in OR  Informed Consent: I have reviewed the patients History and Physical, chart, labs and discussed the procedure including the risks, benefits and alternatives for the proposed anesthesia with the patient or authorized representative who has indicated his/her understanding and acceptance.   Dental advisory given  Plan Discussed with: CRNA and Surgeon  Anesthesia Plan Comments:         Anesthesia Quick Evaluation

## 2017-10-01 NOTE — Anesthesia Procedure Notes (Signed)
Procedure Name: LMA Insertion Date/Time: 10/01/2017 11:48 AM Performed by: Alvera Novel, CRNA Pre-anesthesia Checklist: Patient identified, Emergency Drugs available, Suction available and Patient being monitored Patient Re-evaluated:Patient Re-evaluated prior to induction Oxygen Delivery Method: Circle System Utilized Preoxygenation: Pre-oxygenation with 100% oxygen Induction Type: IV induction Ventilation: Mask ventilation without difficulty LMA: LMA inserted LMA Size: 4.0 Number of attempts: 1 Placement Confirmation: positive ETCO2 Tube secured with: Tape Dental Injury: Teeth and Oropharynx as per pre-operative assessment

## 2017-10-02 ENCOUNTER — Inpatient Hospital Stay (HOSPITAL_COMMUNITY): Payer: Self-pay

## 2017-10-02 ENCOUNTER — Encounter (HOSPITAL_COMMUNITY): Payer: Self-pay | Admitting: Orthopedic Surgery

## 2017-10-02 DIAGNOSIS — L0390 Cellulitis, unspecified: Secondary | ICD-10-CM

## 2017-10-02 LAB — BASIC METABOLIC PANEL
Anion gap: 12 (ref 5–15)
BUN: 8 mg/dL (ref 6–20)
CALCIUM: 9.1 mg/dL (ref 8.9–10.3)
CO2: 26 mmol/L (ref 22–32)
Chloride: 101 mmol/L (ref 98–111)
Creatinine, Ser: 0.69 mg/dL (ref 0.61–1.24)
GFR calc Af Amer: >60 mL/min (ref >60)
GLUCOSE: 150 mg/dL — AB (ref 70–99)
Potassium: 3.8 mmol/L (ref 3.5–5.1)
Sodium: 139 mmol/L (ref 135–145)

## 2017-10-02 MED ORDER — RANITIDINE HCL 150 MG PO TABS
75.0000 mg | ORAL_TABLET | Freq: Every day | ORAL | Status: DC
  Administered 2017-10-02 – 2017-10-06 (×6): 75 mg via ORAL
  Filled 2017-10-02 (×6): qty 1

## 2017-10-02 MED ORDER — SODIUM CHLORIDE 0.9 % IV SOLN
1.5000 g | Freq: Four times a day (QID) | INTRAVENOUS | Status: DC
  Administered 2017-10-02 – 2017-10-06 (×17): 1.5 g via INTRAVENOUS
  Filled 2017-10-02 (×21): qty 1.5

## 2017-10-02 NOTE — Plan of Care (Signed)
  Problem: Health Behavior/Discharge Planning: Goal: Ability to manage health-related needs will improve Outcome: Progressing   Problem: Clinical Measurements: Goal: Ability to maintain clinical measurements within normal limits will improve Outcome: Progressing Goal: Will remain free from infection Outcome: Progressing Goal: Diagnostic test results will improve Outcome: Progressing   Problem: Pain Managment: Goal: General experience of comfort will improve Outcome: Progressing   

## 2017-10-02 NOTE — Progress Notes (Signed)
Pharmacy Antibiotic Note  Cameron Preston is a 32 y.o. male admitted on 09/30/2017 with cellulitis.  Pharmacy has been consulted to broaden coverage for GNR w/ Unasyn dosing.  Plan: Unasyn 1.5g IV Q6H.  Height: 5\' 7"  (170.2 cm) Weight: 165 lb 14.4 oz (75.3 kg) IBW/kg (Calculated) : 66.1  Temp (24hrs), Avg:97.7 F (36.5 C), Min:97 F (36.1 C), Max:98.1 F (36.7 C)  Recent Labs  Lab 09/30/17 0227 09/30/17 0234 10/02/17 0533  WBC 18.5*  --   --   CREATININE 0.72  --  0.69  LATICACIDVEN  --  0.99  --     Estimated Creatinine Clearance: 126.2 mL/min (by C-G formula based on SCr of 0.69 mg/dL).    Allergies  Allergen Reactions  . Klonopin [Clonazepam] Shortness Of Breath and Swelling    Throat swelling and tightness  . Valium [Diazepam] Other (See Comments)    Altered mental status, syncope and confusion  . Xanax [Alprazolam] Shortness Of Breath and Swelling    Throat swelling and tightness  . Tylenol [Acetaminophen] Other (See Comments)    Sneezing, causing stomach problems    Thank you for allowing pharmacy to be a part of this patient's care.  Vernard Gambles, PharmD, BCPS  10/02/2017 7:45 AM

## 2017-10-02 NOTE — Progress Notes (Signed)
  PROGRESS NOTE  Cameron Preston [id number]:[id number] DOB: 28-Jun-1985 DOA: 09/30/2017 PCP: Patient, No Pcp Per  Brief Narrative: 32 year old man presented with bilateral leg pain.  Admitted for bilateral leg infections/abscesses.  Assessment/Plan Right infrapatellar septic bursitis, left popliteal abscess.  Sepsis was initially considered but patient did not meet criteria as he had no evidence of endorgan dysfunction and lactic acid was within normal limits.  Status post bilateral incision and drainage 9/5 by orthopedics. --Cultures growing staph aureus, gram-negative rods.  Continue vancomycin, and Unasyn for gram-negative coverage. --given BLE edema and immobility at home will check venous u/s to r/o DVT  Seizure disorder, reportedly noncompliant with medication. --No seizures here.  Depakote level was low.  Appetite is been restarted.  Patient should not drive for at least 6 months.  Needs close outpatient follow-up.  ADHD.  Hold medication while inpatient.  Marijuana abuse.  Recommend cessation.   Appears to be improving.  Not ready for discharge, requires further observation, antibiotics, surgical care.  DVT prophylaxis: early ambulation.  Code Status: Full Family Communication: none Disposition Plan: home    Brendia Sacks, MD  [facility] Direct contact: [phone] --Via amion app OR  --[email or web address]; password [email or web address]  7PM-7AM contact night coverage as above 10/02/2017, 10:28 AM  LOS: 1 day   Consultants:  Orthopedics   Procedures: IRRIGATION AND DEBRIDEMENT EXTREMITY BLE  Antimicrobials:  Vancomycin 9/4 >  Interval history/Subjective: Feels better today. Less pain today.  Objective: Vitals:  Vitals:   10/02/17 0035 10/02/17 0623  BP: 131/86 132/88  Pulse: 100 78  Resp: 17 18  Temp: 98 F (36.7 C) (!) 97.4 F (36.3 C)  SpO2: 98% 100%    Exam: Constitutional:   . Appears calm and comfortable Respiratory:  . CTA bilaterally, no w/r/r.   . Respiratory effort normal.  Cardiovascular:  . RRR, no m/r/g . decreased BLE extremity edema   Skin:  . Bilateral legs bandaged Psychiatric:  . Mental status o Mood, affect appropriate  I have personally reviewed the following:   Labs:  BMP WNL  Culture moderate Staph aureus, gram negative rod  Imaging studies:  Knee x-rays noted  Scheduled Meds: . divalproex  250 mg Oral BID  . docusate sodium  100 mg Oral BID  . nicotine  14 mg Transdermal Daily  . ranitidine  75 mg Oral Daily   Continuous Infusions: . ampicillin-sulbactam (UNASYN) IV 1.5 g (10/02/17 1001)  . vancomycin 1,000 mg (10/02/17 5852)    Principal Problem:   Cellulitis of right leg Active Problems:   Cellulitis and abscess of leg   Seizure disorder (HCC)   ADHD   Tobacco dependence   Marijuana abuse   LOS: 1 day

## 2017-10-02 NOTE — Progress Notes (Signed)
Bilateral lower extremity venous duplex has been completed. Negative for DVT.  10/02/17 1:41 PM Olen Cordial RVT

## 2017-10-02 NOTE — Progress Notes (Signed)
Patient ID: Cameron Preston, male   DOB: 07-Apr-1985, 32 y.o.   MRN: [id number]   LOS: 1 day   Subjective: Feeling better today, pain controlled.   Objective: Vital signs in last 24 hours: Temp:  [97 F (36.1 C)-98.1 F (36.7 C)] 97.4 F (36.3 C) (09/06 0623) Pulse Rate:  [78-101] 78 (09/06 0623) Resp:  [12-20] 18 (09/06 0623) BP: (126-142)/(80-91) 132/88 (09/06 0623) SpO2:  [86 %-100 %] 100 % (09/06 0623) Weight:  [75.3 kg] 75.3 kg (09/05 1653) Last BM Date: 10/01/17   Laboratory  CBC Recent Labs    09/30/17 0227  WBC 18.5*  HGB 15.0  HCT 46.1  PLT 291   BMET Recent Labs    09/30/17 0227 10/02/17 0533  NA 137 139  K 3.6 3.8  CL 102 101  CO2 24 26  GLUCOSE 123* 150*  BUN 9 8  CREATININE 0.72 0.69  CALCIUM 9.0 9.1     Physical Exam General appearance: alert and no distress  BLE: Dressings intact   Assessment/Plan: Bilateral knee infections s/p I&D -- Continue current dressings, they will be removed tomorrow by rounding team. Continue abx as ordered.    Freeman Caldron, PA-C Orthopedic Surgery [phone] 10/02/2017

## 2017-10-03 LAB — VANCOMYCIN, TROUGH: Vancomycin Tr: <4 ug/mL — ABNORMAL LOW (ref 15–20)

## 2017-10-03 MED ORDER — VANCOMYCIN HCL IN DEXTROSE 1-5 GM/200ML-% IV SOLN
1000.0000 mg | Freq: Three times a day (TID) | INTRAVENOUS | Status: DC
  Administered 2017-10-04 – 2017-10-05 (×6): 1000 mg via INTRAVENOUS
  Filled 2017-10-03 (×7): qty 200

## 2017-10-03 NOTE — Progress Notes (Signed)
  PROGRESS NOTE  LUISANTONIO WERTHEIMER [id number]:[id number] DOB: [birth year] DOA: 09/30/2017 PCP: Patient, No Pcp Per  Brief Narrative: 32 year old man presented with bilateral leg pain.  Admitted for bilateral leg infections/abscesses.  Assessment/Plan Right infrapatellar septic bursitis, left popliteal abscess.  Sepsis was initially considered but patient did not meet criteria as he had no evidence of endorgan dysfunction and lactic acid was within normal limits.  Status post bilateral incision and drainage 9/5 by orthopedics. --Clinically improving.  Cultures with staph aureus and gram-negative rods.  Continue vancomycin, Unasyn pending identification and sensitivities.   --Bilateral lower venous extremity Dopplers were negative for DVT  Seizure disorder, reportedly noncompliant with medication. --No seizures Depakote level was low.  Depakote continued.  Patient should not drive for at least 6 months.  Needs close outpatient follow-up.  ADHD.  Hold medication while inpatient.  Marijuana abuse.  Recommend cessation.   Slowly improving, continues to have significant pain, await culture data, continue empiric antibiotics.  Anticipate discharge next 48 hours pending orthopedic clearance.  DVT prophylaxis: early ambulation.  Code Status: Full Family Communication: none Disposition Plan: home    Brendia Sacks, MD  [facility] Direct contact: [phone] --Via amion app OR  --[email or web address]; password [email or web address]  7PM-7AM contact night coverage as above 10/03/2017, 10:38 AM  LOS: 2 days   Consultants:  Orthopedics   Procedures: IRRIGATION AND DEBRIDEMENT EXTREMITY BLE  Antimicrobials:  Vancomycin 9/4 >  Unasyn 9/6 >  Interval history/Subjective: Still has pain in legs, left greater than right  Objective: Vitals:  Vitals:   10/02/17 2139 10/03/17 0620  BP: 132/79 129/88  Pulse: 90 79  Resp: 18 18  Temp: 97.8 F (36.6 C) 97.7 F (36.5 C)  SpO2: 100% 100%     Exam: Constitutional:   . Appears calm and comfortable Respiratory:  . CTA bilaterally, no w/r/r.  . Respiratory effort normal.  Cardiovascular:  . RRR, no m/r/g . No LE extremity edema   Skin:  . Both legs bandaged with ACE wrap. Psychiatric:  . Mental status o Mood, affect appropriate  I have personally reviewed the following:   Labs:  Culture moderate Staph aureus, gram negative rods  Scheduled Meds: . divalproex  250 mg Oral BID  . docusate sodium  100 mg Oral BID  . nicotine  14 mg Transdermal Daily  . ranitidine  75 mg Oral Daily   Continuous Infusions: . ampicillin-sulbactam (UNASYN) IV 1.5 g (10/03/17 0900)  . vancomycin 1,000 mg (10/03/17 6812)    Principal Problem:   Cellulitis of right leg Active Problems:   Cellulitis and abscess of leg   Seizure disorder (HCC)   ADHD   Tobacco dependence   Marijuana abuse   LOS: 2 days

## 2017-10-03 NOTE — Progress Notes (Signed)
Subjective: 2 Days Post-Op Procedure(s) (LRB): IRRIGATION AND DEBRIDEMENT EXTREMITY (Bilateral) Patient reports pain as moderate.    Objective: Vital signs in last 24 hours: Temp:  [97.7 F (36.5 C)-97.8 F (36.6 C)] 97.7 F (36.5 C) (09/07 0620) Pulse Rate:  [79-106] 79 (09/07 0620) Resp:  [16-18] 18 (09/07 0620) BP: (129-138)/(79-88) 129/88 (09/07 0620) SpO2:  [99 %-100 %] 100 % (09/07 0620)  Intake/Output from previous day: 09/06 0701 - 09/07 0700 In: 806.1 [IV Piggyback:806.1] Out: 700 [Urine:700] Intake/Output this shift: No intake/output data recorded.  No results for input(s): HGB in the last 72 hours. No results for input(s): WBC, RBC, HCT, PLT in the last 72 hours. Recent Labs    10/02/17 0533  NA 139  K 3.8  CL 101  CO2 26  BUN 8  CREATININE 0.69  GLUCOSE 150*  CALCIUM 9.1   No results for input(s): LABPT, INR in the last 72 hours.  Neurovascular intact Sensation intact distally Intact pulses distally Dorsiflexion/Plantar flexion intact Incision: moderate drainage and penrose drains pulled and dressings changed bilat knees No cellulitis present Compartment soft    Assessment/Plan: 2 Days Post-Op Procedure(s) (LRB): IRRIGATION AND DEBRIDEMENT EXTREMITY (Bilateral) WBAT bilat LE dsg change daily or prn Cont abx for infection Contact ortho if questions re wounds or concerns for worsening presentation      Margart Sickles 10/03/2017, 1:13 PM

## 2017-10-03 NOTE — Progress Notes (Signed)
Pharmacy Antibiotic Note  Cameron Preston is a 32 y.o. male admitted on 09/30/2017 with cellulitis.  Pharmacy has been consulted to dose Vancomycin regimen.    Pt height and weight are unknown, Scr was 0.69 mg/dL on 9/6.   Left Leg culture positive for MRSA. (9/5)  Vancomycin trough <4 mcg/ml (Goal 10-15 mcg/ml)  Plan: Increase Vancomycin to 1000 mg IV q8h F/u Scr, Cultures, and progress.   Height: 5\' 7"  (170.2 cm) Weight: 165 lb 14.4 oz (75.3 kg) IBW/kg (Calculated) : 66.1  Temp (24hrs), Avg:97.8 F (36.6 C), Min:97.7 F (36.5 C), Max:97.9 F (36.6 C)  Recent Labs  Lab 09/30/17 0227 09/30/17 0234 10/02/17 0533 10/03/17 1803  WBC 18.5*  --   --   --   CREATININE 0.72  --  0.69  --   LATICACIDVEN  --  0.99  --   --   VANCOTROUGH  --   --   --  <4*    Estimated Creatinine Clearance: 126.2 mL/min (by C-G formula based on SCr of 0.69 mg/dL).    Allergies  Allergen Reactions  . Klonopin [Clonazepam] Shortness Of Breath and Swelling    Throat swelling and tightness  . Valium [Diazepam] Other (See Comments)    Altered mental status, syncope and confusion  . Xanax [Alprazolam] Shortness Of Breath and Swelling    Throat swelling and tightness  . Tylenol [Acetaminophen] Other (See Comments)    Sneezing, causing stomach problems    Cameron Preston, PharmD, BCPS Clinical Pharmacist [place] Pager: [phone] Please utilize Amion for appropriate phone number to reach the unit pharmacist [facility])   10/03/2017 6:52 PM

## 2017-10-04 MED ORDER — OXYCODONE HCL 5 MG PO TABS
10.0000 mg | ORAL_TABLET | ORAL | Status: DC | PRN
  Administered 2017-10-04 – 2017-10-06 (×9): 10 mg via ORAL
  Filled 2017-10-04 (×10): qty 2

## 2017-10-04 NOTE — Progress Notes (Signed)
  PROGRESS NOTE  Cameron Preston MMN:[id number] DOB: 1985-07-26 DOA: 09/30/2017 PCP: Patient, No Pcp Per  Brief Narrative: 32 year old man presented with bilateral leg pain.  Admitted for bilateral leg infections/abscesses.  Assessment/Plan Right infrapatellar septic bursitis, left popliteal abscess.  Sepsis was initially considered but patient did not meet criteria as he had no evidence of endorgan dysfunction and lactic acid was within normal limits.  Status post bilateral incision and drainage 9/5 by orthopedics. Bilateral lower venous extremity Dopplers were negative for DVT.  HIV nonreactive. --Continues to improve.  Second culture growing gram-negative rods, await identification and sensitivity.  Continue vancomycin and Unasyn for now.     Seizure disorder, reportedly noncompliant with medication. --No seizures.  Depakote level was low.  Depakote continued.  Patient should not drive for at least 6 months.  Needs close outpatient follow-up.  ADHD.  Hold medication while inpatient.  Marijuana abuse.  Recommend cessation.   Continues to improve.  Anticipate discharge 9/9 if culture data finalized.  DVT prophylaxis: early ambulation.  Code Status: Full Family Communication: none Disposition Plan: home    Brendia Sacks, MD  [facility] Direct contact: [phone] --Via amion app OR  --[email or web address]; password [email or web address]  7PM-7AM contact night coverage as above 10/04/2017, 11:26 AM  LOS: 3 days   Consultants:  Orthopedics   Procedures: IRRIGATION AND DEBRIDEMENT EXTREMITY BLE  Antimicrobials:  Vancomycin 9/4 >  Unasyn 9/6 >  Interval history/Subjective: Pain in legs, otherwise ok  Objective: Vitals:  Vitals:   10/03/17 2134 10/04/17 0452  BP: 134/83 128/86  Pulse: 85 75  Resp: 18 18  Temp: 98.1 F (36.7 C) (!) 97.4 F (36.3 C)  SpO2: 99% 100%    Exam: Constitutional:   . Appears calm and comfortable Respiratory:  . CTA bilaterally, no w/r/r.   . Respiratory effort normal.  Cardiovascular:  . RRR, no m/r/g . No LE extremity edema   . Skin: legs wrapped with ACE wrap Psychiatric:  . Mental status o Mood, affect appropriate   I have personally reviewed the following:   Labs:    Scheduled Meds: . divalproex  250 mg Oral BID  . docusate sodium  100 mg Oral BID  . nicotine  14 mg Transdermal Daily  . ranitidine  75 mg Oral Daily   Continuous Infusions: . ampicillin-sulbactam (UNASYN) IV 1.5 g (10/04/17 0830)  . vancomycin 1,000 mg (10/04/17 0057)    Principal Problem:   Cellulitis of right leg Active Problems:   Cellulitis and abscess of leg   Seizure disorder (HCC)   ADHD   Tobacco dependence   Marijuana abuse   LOS: 3 days

## 2017-10-05 LAB — CULTURE, BLOOD (ROUTINE X 2)
CULTURE: NO GROWTH
Culture: NO GROWTH
Special Requests: ADEQUATE

## 2017-10-05 LAB — VANCOMYCIN, TROUGH: VANCOMYCIN TR: 7 ug/mL — AB (ref 15–20)

## 2017-10-05 MED ORDER — VANCOMYCIN HCL 10 G IV SOLR
1250.0000 mg | Freq: Three times a day (TID) | INTRAVENOUS | Status: DC
  Administered 2017-10-06 (×2): 1250 mg via INTRAVENOUS
  Filled 2017-10-05 (×5): qty 1250

## 2017-10-05 NOTE — Progress Notes (Signed)
  PROGRESS NOTE  Cameron Preston [id number] DOB: 1985/02/19 DOA: 09/30/2017 PCP: Patient, No Pcp Per  Brief Narrative: 32 year old man presented with bilateral leg pain.  Admitted for bilateral leg infections/abscesses.  Assessment/Plan Right infrapatellar septic bursitis, left popliteal abscess.  Sepsis was initially considered but patient did not meet criteria as he had no evidence of endorgan dysfunction and lactic acid was within normal limits.  Status post bilateral incision and drainage 9/5 by orthopedics. Bilateral lower venous extremity Dopplers were negative for DVT.  HIV nonreactive. --Improving.  Second culture growing gram-negative rods, await finalization.  Continue empiric antibiotic therapy.  Seizure disorder, reportedly noncompliant with medication. --No seizure seizure.  Depakote level low on admission.  Patient should not drive for at least 6 months.  Needs close outpatient follow-up.  ADHD.  Hold medication while inpatient.  Marijuana abuse.  Recommend cessation.   Continues to improve.  Anticipate discharge 9/10, when culture finalized  DVT prophylaxis: early ambulation.  Code Status: Full Family Communication: none Disposition Plan: home    Brendia Sacks, MD  [facility] Direct contact: [phone] --Via amion app OR  --[email or web address]; password [email or web address]  7PM-7AM contact night coverage as above 10/05/2017, 10:37 AM  LOS: 4 days   Consultants:  Orthopedics   Procedures: IRRIGATION AND DEBRIDEMENT EXTREMITY BLE  Antimicrobials:  Vancomycin 9/4 >  Unasyn 9/6 >  Interval history/Subjective: Pain in left leg. Overall ok.  Objective: Vitals:  Vitals:   10/04/17 2322 10/05/17 0615  BP: 125/78 (!) 106/58  Pulse: 80 64  Resp:    Temp: 98.5 F (36.9 C) (!) 97.3 F (36.3 C)  SpO2: 98% 99%    Exam: Constitutional:   . Appears calm and comfortable Respiratory:  . CTA bilaterally, no w/r/r.  . Respiratory effort normal.  Cardiovascular:   . RRR, no m/r/g . No LE extremity edema   . Skin: incisions bandaged with ACE wrap Psychiatric:  . Mental status o Mood, affect appropriate  I have personally reviewed the following:   Labs:  None  Scheduled Meds: . divalproex  250 mg Oral BID  . docusate sodium  100 mg Oral BID  . nicotine  14 mg Transdermal Daily  . ranitidine  75 mg Oral Daily   Continuous Infusions: . ampicillin-sulbactam (UNASYN) IV 1.5 g (10/05/17 0911)  . vancomycin 1,000 mg (10/05/17 0307)    Principal Problem:   Cellulitis of right leg Active Problems:   Cellulitis and abscess of leg   Seizure disorder (HCC)   ADHD   Tobacco dependence   Marijuana abuse   LOS: 4 days

## 2017-10-05 NOTE — Progress Notes (Signed)
Pharmacy Antibiotic Note  Cameron Preston is a 32 y.o. male on day # 6 antbioitcs for bilateral knee infections. S/p I&D on 10/01/17.  Currently on Vancomycin and Unasyn dosing. Day # 6 antibiotics.     Vancomycin trough level tonight is 7 mcg/ml.  Drawn ~1.5 hrs late, but expect true trough ~9 mcg/ml, just below target troughs 10-15 mcg/ml. Noted improving    MRSA in cultures from both legs and GNR in right leg culture. Pending identification.  Plan:  Increase Vancomycin to 1250 mg IV q8h.  Target troughs 10-15 mcg/ml  Continue Unasyn 1.5 gm IV q6hrs.  Follow renal function, final culture data and antibiotic plans.  Bmet at least twice a week.  Recheck Vanc trough level later this week if Vanc continues.  Height: 5\' 7"  (170.2 cm) Weight: 165 lb 14.4 oz (75.3 kg) IBW/kg (Calculated) : 66.1  Temp (24hrs), Avg:98 F (36.7 C), Min:97.3 F (36.3 C), Max:98.5 F (36.9 C)  Recent Labs  Lab 09/30/17 0227 09/30/17 0234 10/02/17 0533 10/03/17 1803 10/05/17 2107  WBC 18.5*  --   --   --   --   CREATININE 0.72  --  0.69  --   --   LATICACIDVEN  --  0.99  --   --   --   VANCOTROUGH  --   --   --  <4* 7*    Estimated Creatinine Clearance: 126.2 mL/min (by C-G formula based on SCr of 0.69 mg/dL).    Antimicrobials this admission: Vancomycin 9/4>> Zosyn 9/4>>9/5 Ancef 9/5>>9/6 Unasyn 9/6>>  Dose adjustments this admission:  9/7: VT <4 on 750 mg IV q8h > incr to 1000mg  IV q8h  9/9: VT 7 mcg/ml (~1.5 hrs late) on 1gm IV q8h > incr 1250 mg IV q8h  Microbiology results:  9/4 blood x 2 - negative   9/5 wound A (left leg) - moderate MRSA, no anaerobes isolated  9/5 wound B (right leg) - few MRSA, rare GNR - pending  9/4 MRSA PCR - negative  Thank you for allowing pharmacy to be a part of this patient's care.  Dennie Fetters, [place] Pager: [phone] 10/05/2017 10:37 PM

## 2017-10-05 NOTE — Progress Notes (Signed)
Patient oozing small amount of sarosanguineous  drainage  on left popliteal area after taking a shower. Area approximated and new dressing aplied

## 2017-10-06 DIAGNOSIS — G40909 Epilepsy, unspecified, not intractable, without status epilepticus: Secondary | ICD-10-CM

## 2017-10-06 DIAGNOSIS — L03115 Cellulitis of right lower limb: Secondary | ICD-10-CM

## 2017-10-06 DIAGNOSIS — L02419 Cutaneous abscess of limb, unspecified: Secondary | ICD-10-CM

## 2017-10-06 DIAGNOSIS — L03119 Cellulitis of unspecified part of limb: Secondary | ICD-10-CM

## 2017-10-06 LAB — AEROBIC/ANAEROBIC CULTURE W GRAM STAIN (SURGICAL/DEEP WOUND): Gram Stain: NONE SEEN

## 2017-10-06 LAB — BASIC METABOLIC PANEL
Anion gap: 7 (ref 5–15)
BUN: 12 mg/dL (ref 6–20)
CHLORIDE: 101 mmol/L (ref 98–111)
CO2: 31 mmol/L (ref 22–32)
Calcium: 9 mg/dL (ref 8.9–10.3)
Creatinine, Ser: 0.72 mg/dL (ref 0.61–1.24)
GFR calc non Af Amer: >60 mL/min (ref >60)
Glucose, Bld: 113 mg/dL — ABNORMAL HIGH (ref 70–99)
Potassium: 4.3 mmol/L (ref 3.5–5.1)
SODIUM: 139 mmol/L (ref 135–145)

## 2017-10-06 LAB — AEROBIC/ANAEROBIC CULTURE (SURGICAL/DEEP WOUND)

## 2017-10-06 MED ORDER — DIVALPROEX SODIUM 250 MG PO DR TAB: 250.0000 mg | DELAYED_RELEASE_TABLET | Freq: Two times a day (BID) | ORAL | 0 refills | Status: DC

## 2017-10-06 MED ORDER — DOXYCYCLINE HYCLATE 50 MG PO CAPS: 100.0000 mg | ORAL_CAPSULE | Freq: Two times a day (BID) | ORAL | 0 refills | Status: DC

## 2017-10-06 NOTE — Progress Notes (Signed)
Pt discharged to home in stable condition.  All discharge instructions and rx's x 2 reviewed with and given to pt.  Pt and belongings transported via wheelchair with transporters x 1 at chairside.  [id number]

## 2017-10-06 NOTE — Discharge Summary (Signed)
Physician Discharge Summary  ERIVERTO BYRNES ZOX:[id number] DOB: 11-Nov-1985 DOA: 09/30/2017  PCP: Patient, No Pcp Per  Patient has follow-up arranged at [facility].  Admit date: 09/30/2017 Discharge date: 10/06/2017  Recommendations for Outpatient Follow-up:  1. Suture removal from incision and drainage both legs  2. Ongoing care for seizure disorder.  Follow-up Information    Sheral Apley, MD. Schedule an appointment as soon as possible for a visit in 1 week(s).   Specialty:  Orthopedic Surgery Why:  1 week Contact information: [address] [place] [address] [phone]        [facility]. Go on 10/15/2017.   Why:  2:30 pm Contact information: [address] [place] [address] [phone]           Discharge Diagnoses:  1. Right infrapatellar septic bursitis, left popliteal MRSA abscess 2. Seizure disorder, reportedly noncompliant with medication.  Discharge Condition: improved Disposition: home  Diet recommendation: regular  Filed Weights   10/01/17 1653  Weight: 75.3 kg    History of present illness:  32 year old man presented with bilateral leg pain.  Admitted for bilateral leg infections/abscesses.  Hospital Course:  Patient was treated with empiric antibiotics, seen by orthopedics and underwent operative incision and drainage with complex wound closure.  Wound cultures grew out MRSA sensitive to doxycycline.  He will complete 4 more days of doxycycline as an outpatient given some drainage of the left wound which appears to be minor.  Wounds appear to be healing well.  Right infrapatellar septic bursitis, left popliteal abscess.  Sepsis was initially considered but patient did not meet criteria as he had no evidence of endorgan dysfunction and lactic acid was within normal limits.  Status post bilateral incision and drainage 9/5 by orthopedics.  Bilateral lower venous extremity Dopplers were negative for DVT.  HIV nonreactive. --Continue to improve.  Discussed with microbiology, no gram-negative rods seen in culture.  Patient improving on empiric vancomycin and ampicillin sulbactam.  Will change to oral doxycycline and follow-up follow final culture, per discussion with microbiology, I do not think patient requires gram-negative coverage further.  Seizure disorder, reportedly noncompliant with medication. --No seizure.  Depakote level low on admission.  Patient should not drive for at least 6 months (have been noncompliant).  Needs close outpatient follow-up.  ADHD.  Hold medication while inpatient.  Marijuana abuse.  Recommend cessation.  Consultants:  Orthopedics   Procedures: IRRIGATION AND DEBRIDEMENT EXTREMITY BLE  Antimicrobials:  Vancomycin 9/4 > 9/10  Unasyn 9/6 > 9/10  Doxycyline 9/10 > 9/13  Today's assessment: S: feels better O: Vitals:  Vitals:   10/05/17 2055 10/06/17 0554  BP: 135/86 120/61  Pulse: 74 63  Resp:  17  Temp: 98.3 F (36.8 C) 97.9 F (36.6 C)  SpO2: 99% 98%    Constitutional:  . Appears calm and comfortable Respiratory:  . CTA bilaterally, no w/r/r.  . Respiratory effort normal. N Cardiovascular:  . RRR, no m/r/g . No LE extremity edema   Skin:  . Right pretibial wound healing very well, sutures in place, the wound is well approximated. Left popliteal wound healing well, well approximated, one open area with mild drainage.  No erythema. Psychiatric:  . Mental status o Mood, affect appropriate    Discharge Instructions  Discharge Instructions    Activity as tolerated - No restrictions   Complete by:  As directed    Diet general  Complete by:  As directed    Discharge instructions   Complete by:  As directed    Call your physician or seek immediate medical attention for pain, drainage, swelling, fever or worsening of condition. Keep wounds clean and dry, may  shower but do not bathe. No driving until cleared by your primary physician. No ladders, swimming by self.     Allergies as of 10/06/2017      Reactions   Klonopin [clonazepam] Shortness Of Breath, Swelling   Throat swelling and tightness   Valium [diazepam] Other (See Comments)   Altered mental status, syncope and confusion   Xanax [alprazolam] Shortness Of Breath, Swelling   Throat swelling and tightness   Tylenol [acetaminophen] Other (See Comments)   Sneezing, causing stomach problems      Medication List    TAKE these medications   amphetamine-dextroamphetamine 15 MG tablet Commonly known as:  ADDERALL Take 15 mg by mouth 2 (two) times daily.   divalproex 250 MG DR tablet Commonly known as:  DEPAKOTE Take 1 tablet (250 mg total) by mouth 2 (two) times daily.   doxycycline 50 MG capsule Commonly known as:  VIBRAMYCIN Take 2 capsules (100 mg total) by mouth 2 (two) times daily.   FLINSTONES GUMMIES OMEGA-3 DHA PO Take 1-2 tablets by mouth daily.   GARLIC PO Take 1 tablet by mouth 3 (three) times a week.   ibuprofen 200 MG tablet Commonly known as:  ADVIL,MOTRIN Take 200 mg by mouth every 6 (six) hours as needed for moderate pain.   ranitidine 150 MG tablet Commonly known as:  ZANTAC Take 75 mg by mouth daily.   VITAMIN B-12 PO Take 1 tablet by mouth daily.            Durable Medical Equipment  (From admission, onward)         Start     Ordered   10/06/17 1347  For home use only DME Walker  Once    Question:  Patient needs a walker to treat with the following condition  Answer:  Leg pain, bilateral   10/06/17 1346         Allergies  Allergen Reactions  . Klonopin [Clonazepam] Shortness Of Breath and Swelling    Throat swelling and tightness  . Valium [Diazepam] Other (See Comments)    Altered mental status, syncope and confusion  . Xanax [Alprazolam] Shortness Of Breath and Swelling    Throat swelling and tightness  . Tylenol [Acetaminophen]  Other (See Comments)    Sneezing, causing stomach problems    The results of significant diagnostics from this hospitalization (including imaging, microbiology, ancillary and laboratory) are listed below for reference.    Significant Diagnostic Studies: Dg Knee Complete 4 Views Left  Result Date: 09/30/2017 CLINICAL DATA:  Initial evaluation for acute swelling, redness, cellulitis, recent abscess to right knee EXAM: LEFT KNEE - COMPLETE 4+ VIEW COMPARISON:  None. FINDINGS: No acute fracture or dislocation. No significant joint effusion. Pellegrini-Stieda lesion present at the medial femoral condyle, suggesting prior/remote avulsion injury involving the medial collateral ligament. Joint spaces maintained without evidence for significant degenerative or erosive arthropathy. No appreciable soft tissue swelling or other abnormality. IMPRESSION: 1. No acute soft tissue abnormality identified about the knee. 2. No acute osseous abnormality about the left knee. 3. Pellegrini-Stieda lesion at the medial femoral condyle, suggesting prior medial collateral ligament injury. Electronically Signed   By: Rise Mu M.D.   On: 09/30/2017 05:42   Dg Knee Complete 4  Views Right  Result Date: 09/30/2017 CLINICAL DATA:  Initial evaluation for acute pain, swelling, redness, cellulitis about right knee. Recent abscess at right knee. EXAM: RIGHT KNEE - COMPLETE 4+ VIEW COMPARISON:  None. FINDINGS: No acute fracture or dislocation. No joint effusion. Soft tissue swelling involving the prepatellar and infrapatellar soft tissues anteriorly. No other acute soft tissue abnormality. No soft tissue emphysema. Joint spaces maintained without evidence for significant degenerative or erosive arthropathy. IMPRESSION: 1. Prominent soft tissue swelling involving the prepatellar and infrapatellar soft tissues anteriorly, which could reflect infection/cellulitis. No soft tissue emphysema or radiopaque foreign body. 2. No acute  osseous abnormality about the right knee. Electronically Signed   By: Rise Mu M.D.   On: 09/30/2017 05:44    Microbiology: Recent Results (from the past 240 hour(s))  Blood culture (routine x 2)     Status: None   Collection Time: 09/30/17  4:30 AM  Result Value Ref Range Status   Specimen Description BLOOD RIGHT ARM  Final   Special Requests   Final    BOTTLES DRAWN AEROBIC AND ANAEROBIC Blood Culture results may not be optimal due to an excessive volume of blood received in culture bottles   Culture   Final    NO GROWTH 5 DAYS Performed at [facility], [address], [place], [place] [address]    Report Status 10/05/2017 FINAL  Final  Blood culture (routine x 2)     Status: None   Collection Time: 09/30/17  4:35 AM  Result Value Ref Range Status   Specimen Description BLOOD RIGHT ARM  Final   Special Requests   Final    BOTTLES DRAWN AEROBIC AND ANAEROBIC Blood Culture adequate volume   Culture   Final    NO GROWTH 5 DAYS Performed at [facility], [address], [place], [place] [address]    Report Status 10/05/2017 FINAL  Final  MRSA PCR Screening     Status: None   Collection Time: 09/30/17 10:15 AM  Result Value Ref Range Status   MRSA by PCR NEGATIVE NEGATIVE Final    Comment:        The GeneXpert MRSA Assay (FDA approved for NASAL specimens only), is one component of a comprehensive MRSA colonization surveillance program. It is not intended to diagnose MRSA infection nor to guide or monitor treatment for MRSA infections. Performed at [facility], [address], [place], [place] [address]   Aerobic/Anaerobic Culture (surgical/deep wound)     Status: None (Preliminary result)   Collection Time: 10/01/17 12:17 PM  Result Value Ref Range Status   Specimen Description WOUND SPEC A  Final   Special Requests LEFT LEG  Final   Gram Stain   Final    ABUNDANT WBC PRESENT, PREDOMINANTLY PMN MODERATE GRAM POSITIVE COCCI Performed at  [facility], [address], [place], [place] [address]    Culture   Final    MODERATE METHICILLIN RESISTANT STAPHYLOCOCCUS AUREUS NO ANAEROBES ISOLATED; CULTURE IN PROGRESS FOR 5 DAYS    Report Status PENDING  Incomplete   Organism ID, Bacteria METHICILLIN RESISTANT STAPHYLOCOCCUS AUREUS  Final      Susceptibility   Methicillin resistant staphylococcus aureus - MIC*    CIPROFLOXACIN <=0.5 SENSITIVE Sensitive     ERYTHROMYCIN >=8 RESISTANT Resistant     GENTAMICIN <=0.5 SENSITIVE Sensitive     OXACILLIN >=4 RESISTANT Resistant     TETRACYCLINE <=1 SENSITIVE Sensitive     VANCOMYCIN <=0.5  SENSITIVE Sensitive     TRIMETH/SULFA <=10 SENSITIVE Sensitive     CLINDAMYCIN <=0.25 SENSITIVE Sensitive     RIFAMPIN <=0.5 SENSITIVE Sensitive     Inducible Clindamycin NEGATIVE Sensitive     * MODERATE METHICILLIN RESISTANT STAPHYLOCOCCUS AUREUS  Aerobic/Anaerobic Culture (surgical/deep wound)     Status: None (Preliminary result)   Collection Time: 10/01/17 12:18 PM  Result Value Ref Range Status   Specimen Description WOUND SPEC B  Final   Special Requests RIGHT LEG  Final   Gram Stain   Final    NO WBC SEEN RARE GRAM NEGATIVE RODS Performed at [facility], [address] [place], [place] [address]    Culture   Final    FEW METHICILLIN RESISTANT STAPHYLOCOCCUS AUREUS NO ANAEROBES ISOLATED; CULTURE IN PROGRESS FOR 5 DAYS    Report Status PENDING  Incomplete   Organism ID, Bacteria METHICILLIN RESISTANT STAPHYLOCOCCUS AUREUS  Final      Susceptibility   Methicillin resistant staphylococcus aureus - MIC*    CIPROFLOXACIN <=0.5 SENSITIVE Sensitive     ERYTHROMYCIN >=8 RESISTANT Resistant     GENTAMICIN <=0.5 SENSITIVE Sensitive     OXACILLIN >=4 RESISTANT Resistant     TETRACYCLINE <=1 SENSITIVE Sensitive     VANCOMYCIN <=0.5 SENSITIVE Sensitive     TRIMETH/SULFA 80 RESISTANT Resistant     CLINDAMYCIN >=8 RESISTANT Resistant     RIFAMPIN <=0.5 SENSITIVE Sensitive      Inducible Clindamycin NEGATIVE Sensitive     * FEW METHICILLIN RESISTANT STAPHYLOCOCCUS AUREUS     Labs: Basic Metabolic Panel: Recent Labs  Lab 09/30/17 0227 10/02/17 0533 10/06/17 0427  NA 137 139 139  K 3.6 3.8 4.3  CL 102 101 101  CO2 24 26 31   GLUCOSE 123* 150* 113*  BUN 9 8 12   CREATININE 0.72 0.69 0.72  CALCIUM 9.0 9.1 9.0   Liver Function Tests: Recent Labs  Lab 09/30/17 0227  AST 28  ALT 23  ALKPHOS 99  BILITOT 0.9  PROT 7.6  ALBUMIN 3.9   CBC: Recent Labs  Lab 09/30/17 0227  WBC 18.5*  NEUTROABS 14.7*  HGB 15.0  HCT 46.1  MCV 90.0  PLT 291    Principal Problem:   Cellulitis of right leg Active Problems:   Cellulitis and abscess of leg   Seizure disorder (HCC)   ADHD   Tobacco dependence   Marijuana abuse   Time coordinating discharge: 35 minutes  Signed:  Brendia Sacks, MD [facility] 10/06/2017, 2:03 PM

## 2017-10-15 ENCOUNTER — Inpatient Hospital Stay: Payer: Self-pay

## 2017-10-15 NOTE — Progress Notes (Deleted)
Patient ID: Cameron Preston, male   DOB: 14-Sep-1985, 32 y.o.   MRN: [id number]  After hospitalization for septic bursitis 09/30/2017-10/06/2017.    From discharge summary: History of present illness:  32 year old man presented with bilateral leg pain. Admitted for bilateral leg infections/abscesses.  Hospital Course:  Patient was treated with empiric antibiotics, seen by orthopedics and underwent operative incision and drainage with complex wound closure.  Wound cultures grew out MRSA sensitive to doxycycline.  He will complete 4 more days of doxycycline as an outpatient given some drainage of the left wound which appears to be minor.  Wounds appear to be healing well.  Right infrapatellar septic bursitis, left popliteal abscess.Sepsis was initially considered but patient did not meet criteria as he had no evidence of endorgan dysfunction and lactic acid was within normal limits. Status post bilateral incision and drainage 9/5 by orthopedics. Bilateral lower venous extremity Dopplers were negative for DVT. HIV nonreactive. --Continue to improve.  Discussed with microbiology, no gram-negative rods seen in culture.  Patient improving on empiric vancomycin and ampicillin sulbactam.  Will change to oral doxycycline and follow-up follow final culture, per discussion with microbiology, I do not think patient requires gram-negative coverage further.  Seizure disorder, reportedly noncompliant with medication. --No seizure. Depakote level low on admission. Patient should not drive for at least 6 months (have been noncompliant). Needs close outpatient follow-up.  ADHD. Hold medication while inpatient.  Marijuana abuse. Recommend cessation.  Consultants:  Orthopedics   Procedures: IRRIGATION AND DEBRIDEMENT EXTREMITY BLE  Antimicrobials:  Vancomycin 9/4 > 9/10  Unasyn 9/6 > 9/10  Doxycyline 9/10 > 9/13

## 2018-01-25 ENCOUNTER — Encounter (HOSPITAL_COMMUNITY): Payer: Self-pay | Admitting: Emergency Medicine

## 2018-01-25 ENCOUNTER — Emergency Department (HOSPITAL_COMMUNITY): Payer: Self-pay

## 2018-01-25 ENCOUNTER — Emergency Department (HOSPITAL_COMMUNITY)
Admission: EM | Admit: 2018-01-25 | Discharge: 2018-01-25 | Disposition: A | Discharge status: Home / Self Care | Payer: Self-pay | Attending: Emergency Medicine | Admitting: Emergency Medicine

## 2018-01-25 DIAGNOSIS — R0789 Other chest pain: Secondary | ICD-10-CM | POA: Insufficient documentation

## 2018-01-25 DIAGNOSIS — Z23 Encounter for immunization: Secondary | ICD-10-CM | POA: Insufficient documentation

## 2018-01-25 DIAGNOSIS — F17200 Nicotine dependence, unspecified, uncomplicated: Secondary | ICD-10-CM | POA: Insufficient documentation

## 2018-01-25 DIAGNOSIS — Y9289 Other specified places as the place of occurrence of the external cause: Secondary | ICD-10-CM | POA: Insufficient documentation

## 2018-01-25 DIAGNOSIS — Y9389 Activity, other specified: Secondary | ICD-10-CM | POA: Insufficient documentation

## 2018-01-25 DIAGNOSIS — M542 Cervicalgia: Secondary | ICD-10-CM | POA: Insufficient documentation

## 2018-01-25 DIAGNOSIS — M549 Dorsalgia, unspecified: Secondary | ICD-10-CM | POA: Insufficient documentation

## 2018-01-25 DIAGNOSIS — Y999 Unspecified external cause status: Secondary | ICD-10-CM | POA: Insufficient documentation

## 2018-01-25 DIAGNOSIS — Z79899 Other long term (current) drug therapy: Secondary | ICD-10-CM | POA: Insufficient documentation

## 2018-01-25 DIAGNOSIS — R000 Tachycardia, unspecified: Secondary | ICD-10-CM | POA: Insufficient documentation

## 2018-01-25 DIAGNOSIS — R109 Unspecified abdominal pain: Secondary | ICD-10-CM | POA: Insufficient documentation

## 2018-01-25 DIAGNOSIS — R102 Pelvic and perineal pain: Secondary | ICD-10-CM | POA: Insufficient documentation

## 2018-01-25 DIAGNOSIS — R0682 Tachypnea, not elsewhere classified: Secondary | ICD-10-CM | POA: Insufficient documentation

## 2018-01-25 LAB — CBC WITH DIFFERENTIAL/PLATELET
Abs Immature Granulocytes: 0 10*3/uL (ref 0.00–0.07)
BASOS PCT: 1 %
Basophils Absolute: 0.1 10*3/uL (ref 0.0–0.1)
EOS ABS: 0.3 10*3/uL (ref 0.0–0.5)
Eosinophils Relative: 4 %
HCT: 43.4 % (ref 39.0–52.0)
Hemoglobin: 14.6 g/dL (ref 13.0–17.0)
Lymphocytes Relative: 23 %
Lymphs Abs: 1.7 10*3/uL (ref 0.7–4.0)
MCH: 29.8 pg (ref 26.0–34.0)
MCHC: 33.6 g/dL (ref 30.0–36.0)
MCV: 88.6 fL (ref 80.0–100.0)
MONO ABS: 0.5 10*3/uL (ref 0.1–1.0)
Monocytes Relative: 6 %
NEUTROS ABS: 5 10*3/uL (ref 1.7–7.7)
NRBC: 0 /100{WBCs}
Neutrophils Relative %: 66 %
Platelets: 252 10*3/uL (ref 150–400)
RBC: 4.9 MIL/uL (ref 4.22–5.81)
RDW: 13.2 % (ref 11.5–15.5)
WBC: 7.5 10*3/uL (ref 4.0–10.5)
nRBC: 0 % (ref 0.0–0.2)

## 2018-01-25 LAB — COMPREHENSIVE METABOLIC PANEL
ALT: 39 U/L (ref 0–44)
AST: 40 U/L (ref 15–41)
Albumin: 3.9 g/dL (ref 3.5–5.0)
Alkaline Phosphatase: 84 U/L (ref 38–126)
Anion gap: 11 (ref 5–15)
BUN: 7 mg/dL (ref 6–20)
CHLORIDE: 103 mmol/L (ref 98–111)
CO2: 24 mmol/L (ref 22–32)
Calcium: 9 mg/dL (ref 8.9–10.3)
Creatinine, Ser: 0.84 mg/dL (ref 0.61–1.24)
GFR calc Af Amer: >60 mL/min (ref >60)
Glucose, Bld: 87 mg/dL (ref 70–99)
POTASSIUM: 4.2 mmol/L (ref 3.5–5.1)
Sodium: 138 mmol/L (ref 135–145)
Total Bilirubin: 1.2 mg/dL (ref 0.3–1.2)
Total Protein: 7.2 g/dL (ref 6.5–8.1)

## 2018-01-25 LAB — URINALYSIS, ROUTINE W REFLEX MICROSCOPIC
Bilirubin Urine: NEGATIVE
Glucose, UA: NEGATIVE mg/dL
Hgb urine dipstick: NEGATIVE
Ketones, ur: NEGATIVE mg/dL
Leukocytes, UA: NEGATIVE
Nitrite: NEGATIVE
Protein, ur: NEGATIVE mg/dL
Specific Gravity, Urine: 1.003 — ABNORMAL LOW (ref 1.005–1.030)
pH: 7 (ref 5.0–8.0)

## 2018-01-25 MED ORDER — MORPHINE SULFATE (PF) 4 MG/ML IV SOLN
8.0000 mg | Freq: Once | INTRAVENOUS | Status: AC
  Administered 2018-01-25: 8 mg via INTRAVENOUS
  Filled 2018-01-25: qty 2

## 2018-01-25 MED ORDER — SODIUM CHLORIDE 0.9 % IV BOLUS
1000.0000 mL | Freq: Once | INTRAVENOUS | Status: AC
  Administered 2018-01-25: 1000 mL via INTRAVENOUS

## 2018-01-25 MED ORDER — IOHEXOL 300 MG/ML  SOLN
100.0000 mL | Freq: Once | INTRAMUSCULAR | Status: AC | PRN
  Administered 2018-01-25: 100 mL via INTRAVENOUS

## 2018-01-25 MED ORDER — KETOROLAC TROMETHAMINE 15 MG/ML IJ SOLN
15.0000 mg | Freq: Once | INTRAMUSCULAR | Status: AC
  Administered 2018-01-25: 15 mg via INTRAVENOUS
  Filled 2018-01-25: qty 1

## 2018-01-25 MED ORDER — TETANUS-DIPHTH-ACELL PERTUSSIS 5-2.5-18.5 LF-MCG/0.5 IM SUSP
0.5000 mL | Freq: Once | INTRAMUSCULAR | Status: AC
  Administered 2018-01-25: 0.5 mL via INTRAMUSCULAR
  Filled 2018-01-25: qty 0.5

## 2018-01-25 MED ORDER — ONDANSETRON HCL 4 MG/2ML IJ SOLN
4.0000 mg | Freq: Once | INTRAMUSCULAR | Status: AC
  Administered 2018-01-25: 4 mg via INTRAVENOUS
  Filled 2018-01-25: qty 2

## 2018-01-25 NOTE — Discharge Instructions (Signed)
Take 4 over the counter ibuprofen tablets 3 times a day or 2 over-the-counter naproxen tablets twice a day for pain. Also take tylenol 1000mg(2 extra strength) four times a day.    
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
## 2018-10-11 ENCOUNTER — Emergency Department (HOSPITAL_COMMUNITY)
Admission: EM | Admit: 2018-10-11 | Discharge: 2018-10-12 | Disposition: A | Discharge status: Home / Self Care | Payer: Self-pay | Attending: Emergency Medicine | Admitting: Emergency Medicine

## 2018-10-11 ENCOUNTER — Encounter (HOSPITAL_COMMUNITY): Payer: Self-pay | Admitting: Pharmacy Technician

## 2018-10-11 ENCOUNTER — Other Ambulatory Visit: Payer: Self-pay

## 2018-10-11 DIAGNOSIS — M7989 Other specified soft tissue disorders: Secondary | ICD-10-CM

## 2018-10-11 DIAGNOSIS — F1210 Cannabis abuse, uncomplicated: Secondary | ICD-10-CM | POA: Insufficient documentation

## 2018-10-11 DIAGNOSIS — Y9355 Activity, bike riding: Secondary | ICD-10-CM | POA: Insufficient documentation

## 2018-10-11 DIAGNOSIS — Y999 Unspecified external cause status: Secondary | ICD-10-CM | POA: Insufficient documentation

## 2018-10-11 DIAGNOSIS — Y929 Unspecified place or not applicable: Secondary | ICD-10-CM | POA: Insufficient documentation

## 2018-10-11 DIAGNOSIS — F17210 Nicotine dependence, cigarettes, uncomplicated: Secondary | ICD-10-CM | POA: Insufficient documentation

## 2018-10-11 DIAGNOSIS — R319 Hematuria, unspecified: Secondary | ICD-10-CM

## 2018-10-11 DIAGNOSIS — S301XXA Contusion of abdominal wall, initial encounter: Secondary | ICD-10-CM | POA: Insufficient documentation

## 2018-10-11 LAB — URINALYSIS, ROUTINE W REFLEX MICROSCOPIC
Bacteria, UA: NONE SEEN
Bilirubin Urine: NEGATIVE
Glucose, UA: NEGATIVE mg/dL
Ketones, ur: NEGATIVE mg/dL
Leukocytes,Ua: NEGATIVE
Nitrite: NEGATIVE
Protein, ur: NEGATIVE mg/dL
RBC / HPF: >50 RBC/hpf — ABNORMAL HIGH (ref 0–5)
Specific Gravity, Urine: 1.011 (ref 1.005–1.030)
pH: 7 (ref 5.0–8.0)

## 2018-10-11 LAB — CBC WITH DIFFERENTIAL/PLATELET
Abs Immature Granulocytes: 0.02 10*3/uL (ref 0.00–0.07)
Basophils Absolute: 0.1 10*3/uL (ref 0.0–0.1)
Basophils Relative: 1 %
Eosinophils Absolute: 0.4 10*3/uL (ref 0.0–0.5)
Eosinophils Relative: 5 %
HCT: 40.4 % (ref 39.0–52.0)
Hemoglobin: 13 g/dL (ref 13.0–17.0)
Immature Granulocytes: 0 %
Lymphocytes Relative: 25 %
Lymphs Abs: 2 10*3/uL (ref 0.7–4.0)
MCH: 29.7 pg (ref 26.0–34.0)
MCHC: 32.2 g/dL (ref 30.0–36.0)
MCV: 92.4 fL (ref 80.0–100.0)
Monocytes Absolute: 0.4 10*3/uL (ref 0.1–1.0)
Monocytes Relative: 5 %
Neutro Abs: 5.1 10*3/uL (ref 1.7–7.7)
Neutrophils Relative %: 64 %
Platelets: 305 10*3/uL (ref 150–400)
RBC: 4.37 MIL/uL (ref 4.22–5.81)
RDW: 13.9 % (ref 11.5–15.5)
WBC: 8 10*3/uL (ref 4.0–10.5)
nRBC: 0 % (ref 0.0–0.2)

## 2018-10-11 LAB — COMPREHENSIVE METABOLIC PANEL
ALT: 25 U/L (ref 0–44)
AST: 27 U/L (ref 15–41)
Albumin: 3.3 g/dL — ABNORMAL LOW (ref 3.5–5.0)
Alkaline Phosphatase: 78 U/L (ref 38–126)
Anion gap: 7 (ref 5–15)
BUN: 6 mg/dL (ref 6–20)
CO2: 28 mmol/L (ref 22–32)
Calcium: 8.7 mg/dL — ABNORMAL LOW (ref 8.9–10.3)
Chloride: 107 mmol/L (ref 98–111)
Creatinine, Ser: 0.74 mg/dL (ref 0.61–1.24)
GFR calc Af Amer: >60 mL/min (ref >60)
GFR calc non Af Amer: >60 mL/min (ref >60)
Glucose, Bld: 85 mg/dL (ref 70–99)
Potassium: 4.1 mmol/L (ref 3.5–5.1)
Sodium: 142 mmol/L (ref 135–145)
Total Bilirubin: 0.3 mg/dL (ref 0.3–1.2)
Total Protein: 6.6 g/dL (ref 6.5–8.1)

## 2018-10-11 MED ORDER — SODIUM CHLORIDE 0.9% FLUSH
3.0000 mL | Freq: Once | INTRAVENOUS | Status: AC
  Administered 2018-10-12: 3 mL via INTRAVENOUS
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
## 2018-10-11 NOTE — ED Notes (Signed)
Unable to locate pt in lobby, attempted to call cell phone without success

## 2018-10-12 ENCOUNTER — Emergency Department (HOSPITAL_COMMUNITY): Payer: Self-pay

## 2018-10-12 MED ORDER — IOHEXOL 300 MG/ML  SOLN
100.0000 mL | Freq: Once | INTRAMUSCULAR | Status: AC | PRN
  Administered 2018-10-12: 100 mL via INTRAVENOUS
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
## 2018-10-12 NOTE — ED Notes (Signed)
Unable to locate x 2 in lobby

## 2018-10-12 NOTE — ED Notes (Signed)
Pt found by EMT in lobby, placed in room
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
## 2018-10-12 NOTE — ED Notes (Addendum)
Pt in CT.

## 2018-10-12 NOTE — ED Notes (Signed)
Patient transported to CT 

## 2018-10-12 NOTE — ED Notes (Signed)
EDP at bedside  

## 2018-10-12 NOTE — ED Notes (Signed)
Patient verbalizes understanding of discharge instructions. Opportunity for questioning and answers were provided. Armband removed by staff, pt discharged from ED.  

## 2018-11-02 ENCOUNTER — Emergency Department (HOSPITAL_COMMUNITY): Payer: Self-pay

## 2018-11-02 ENCOUNTER — Emergency Department (HOSPITAL_COMMUNITY)
Admission: EM | Admit: 2018-11-02 | Discharge: 2018-11-02 | Disposition: A | Discharge status: Home / Self Care | Payer: Self-pay | Attending: Emergency Medicine | Admitting: Emergency Medicine

## 2018-11-02 DIAGNOSIS — F17210 Nicotine dependence, cigarettes, uncomplicated: Secondary | ICD-10-CM | POA: Insufficient documentation

## 2018-11-02 DIAGNOSIS — R0602 Shortness of breath: Secondary | ICD-10-CM | POA: Insufficient documentation

## 2018-11-02 DIAGNOSIS — I252 Old myocardial infarction: Secondary | ICD-10-CM | POA: Insufficient documentation

## 2018-11-02 DIAGNOSIS — Z8669 Personal history of other diseases of the nervous system and sense organs: Secondary | ICD-10-CM | POA: Insufficient documentation

## 2018-11-02 DIAGNOSIS — F909 Attention-deficit hyperactivity disorder, unspecified type: Secondary | ICD-10-CM | POA: Insufficient documentation

## 2018-11-02 DIAGNOSIS — R42 Dizziness and giddiness: Secondary | ICD-10-CM | POA: Insufficient documentation

## 2018-11-02 DIAGNOSIS — R0789 Other chest pain: Secondary | ICD-10-CM | POA: Insufficient documentation

## 2018-11-02 DIAGNOSIS — F1290 Cannabis use, unspecified, uncomplicated: Secondary | ICD-10-CM | POA: Insufficient documentation

## 2018-11-02 DIAGNOSIS — R531 Weakness: Secondary | ICD-10-CM | POA: Insufficient documentation

## 2018-11-02 LAB — CBC WITH DIFFERENTIAL/PLATELET
Abs Immature Granulocytes: 0 10*3/uL (ref 0.00–0.07)
Basophils Absolute: 0.1 10*3/uL (ref 0.0–0.1)
Basophils Relative: 1 %
Eosinophils Absolute: 0.2 10*3/uL (ref 0.0–0.5)
Eosinophils Relative: 3 %
HCT: 42.9 % (ref 39.0–52.0)
Hemoglobin: 14.7 g/dL (ref 13.0–17.0)
Immature Granulocytes: 0 %
Lymphocytes Relative: 30 %
Lymphs Abs: 1.6 10*3/uL (ref 0.7–4.0)
MCH: 31.1 pg (ref 26.0–34.0)
MCHC: 34.3 g/dL (ref 30.0–36.0)
MCV: 90.9 fL (ref 80.0–100.0)
Monocytes Absolute: 0.6 10*3/uL (ref 0.1–1.0)
Monocytes Relative: 10 %
Neutro Abs: 2.9 10*3/uL (ref 1.7–7.7)
Neutrophils Relative %: 56 %
Platelets: 269 10*3/uL (ref 150–400)
RBC: 4.72 MIL/uL (ref 4.22–5.81)
RDW: 13.8 % (ref 11.5–15.5)
WBC: 5.3 10*3/uL (ref 4.0–10.5)
nRBC: 0 % (ref 0.0–0.2)

## 2018-11-02 LAB — COMPREHENSIVE METABOLIC PANEL
ALT: 29 U/L (ref 0–44)
AST: 32 U/L (ref 15–41)
Albumin: 3.7 g/dL (ref 3.5–5.0)
Alkaline Phosphatase: 96 U/L (ref 38–126)
Anion gap: 10 (ref 5–15)
BUN: 8 mg/dL (ref 6–20)
CO2: 24 mmol/L (ref 22–32)
Calcium: 9 mg/dL (ref 8.9–10.3)
Chloride: 104 mmol/L (ref 98–111)
Creatinine, Ser: 0.82 mg/dL (ref 0.61–1.24)
GFR calc Af Amer: >60 mL/min (ref >60)
GFR calc non Af Amer: >60 mL/min (ref >60)
Glucose, Bld: 119 mg/dL — ABNORMAL HIGH (ref 70–99)
Potassium: 4 mmol/L (ref 3.5–5.1)
Sodium: 138 mmol/L (ref 135–145)
Total Bilirubin: 0.5 mg/dL (ref 0.3–1.2)
Total Protein: 6.7 g/dL (ref 6.5–8.1)

## 2018-11-02 LAB — TROPONIN I (HIGH SENSITIVITY)
Troponin I (High Sensitivity): 3 ng/L (ref <18)
Troponin I (High Sensitivity): 3 ng/L (ref <18)

## 2018-11-02 MED ORDER — METHOCARBAMOL 500 MG PO TABS
500.0000 mg | ORAL_TABLET | Freq: Once | ORAL | Status: AC
  Administered 2018-11-02: 500 mg via ORAL
  Filled 2018-11-02: qty 1

## 2018-11-02 MED ORDER — METHOCARBAMOL 500 MG PO TABS: 500.0000 mg | ORAL_TABLET | Freq: Two times a day (BID) | ORAL | 0 refills | Status: AC

## 2018-11-02 MED ORDER — IBUPROFEN 400 MG PO TABS
600.0000 mg | ORAL_TABLET | Freq: Once | ORAL | Status: AC
  Administered 2018-11-02: 600 mg via ORAL
  Filled 2018-11-02: qty 1

## 2018-11-02 MED ORDER — DICLOFENAC SODIUM 1 % TD GEL: 4.0000 g | Freq: Four times a day (QID) | TRANSDERMAL | 1 refills | Status: AC

## 2018-11-02 MED ORDER — SODIUM CHLORIDE 0.9% FLUSH: 3.0000 mL | Freq: Once | INTRAVENOUS | Status: DC

## 2018-11-02 MED ORDER — NICOTINE 21 MG/24HR TD PT24
21.0000 mg | MEDICATED_PATCH | Freq: Once | TRANSDERMAL | Status: DC
  Administered 2018-11-02: 13:00:00 21 mg via TRANSDERMAL
  Filled 2018-11-02: qty 1

## 2018-11-02 NOTE — ED Triage Notes (Signed)
Pt arrives by [facility] with c/o of chest pain that started 1 hour ago. Pt reports hx of MI. Pt also took a 20mg  oxycodone that he got from someone.

## 2018-11-02 NOTE — Discharge Instructions (Addendum)
Expect your soreness to increase over the next 2-3 days. Take it easy, but do not lay around too much as this may make any stiffness worse.  Antiinflammatory medications: Take 600 mg of ibuprofen every 6 hours or 440 mg (over the counter dose) to 500 mg (prescription dose) of naproxen every 12 hours for the next 3 days. After this time, these medications may be used as needed for pain. Take these medications with food to avoid upset stomach. Choose only one of these medications, do not take them together. Acetaminophen (generic for Tylenol): Should you continue to have additional pain while taking the ibuprofen or naproxen, you may add in acetaminophen as needed. Your daily total maximum amount of acetaminophen from all sources should be limited to 4000mg /day for persons without liver problems, or 2000mg /day for those with liver problems. Diclofenac gel: This is a topical anti-inflammatory medication and can be applied directly to the painful region.  Do not use on the face or genitals.  This medication may be used as an alternative to oral anti-inflammatory medications, such as ibuprofen or naproxen. Methocarbamol: Methocarbamol (generic for Robaxin) is a muscle relaxer and can help relieve stiff muscles or muscle spasms.  Do not drive or perform other dangerous activities while taking this medication as it can cause drowsiness as well as changes in reaction time and judgement. Lidocaine patches: These are available via either prescription or over-the-counter. The over-the-counter option may be more economical one and are likely just as effective. There are multiple over-the-counter brands, such as Salonpas. Follow up: Follow up with a primary care provider for any future management of these complaints. Be sure to follow up within 7-10 days. Return: Return to the ED should symptoms worsen.  For prescription assistance, may try using prescription discount sites or apps, such as goodrx.com

## 2018-11-02 NOTE — ED Provider Notes (Signed)
[facility] EMERGENCY DEPARTMENT Provider Note   CSN: [id number] Arrival date & time: 11/02/18  8546     History   Chief Complaint Chief Complaint  Patient presents with  . Chest Pain    HPI Cameron Preston is a 33 y.o. male.     HPI   Cameron Preston is a 33 y.o. male, with a history of bowel obstruction and seizures, presenting to the ED with chest pain beginning this morning around 9 AM while walking.  Initially felt generally weak and lightheaded at onset.  Increased soreness with breathing makes him feel short of breath.  Also increases with palpation and movement. Pain is waxing and waning, left side chest, pulsating and sharp, radiating to left lateral chest, ranges from 7-9/10.  Smokes marijuana about every 2 weeks. Denies alcohol use. 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 Denies fever/chills, N/V/C/D, abdominal pain, syncope, lower extremity edema/pain, back pain, focal weakness, numbness, falls/trauma, or any other complaints.  Past Medical History:  Diagnosis Date  . ADHD   . Bowel obstruction (HCC)   . Myocardial infarction (HCC) 05/2017   reports "I had a heart attack" - left the hospital AMA but has followed up with cardiology  . Seizures (HCC)    last seizure was Saturday; he is unable to afford medication and so has been noncompliant    Patient Active Problem List   Diagnosis Date Noted  . Cellulitis of right leg 10/01/2017  . Cellulitis and abscess of leg 09/30/2017  . Seizure disorder (HCC) 09/30/2017  . ADHD 09/30/2017  . Tobacco dependence 09/30/2017  . Marijuana abuse 09/30/2017    Past Surgical History:  Procedure Laterality Date  . ABDOMINAL SURGERY    . APPENDECTOMY     . FRACTURE SURGERY     hand repair  . I&D EXTREMITY Bilateral 10/01/2017   Procedure: IRRIGATION AND DEBRIDEMENT EXTREMITY;  Surgeon: Sheral Apley, MD;  Location: [facility] OR;  Service: Orthopedics;  Laterality: Bilateral;        Home Medications    Prior to Admission medications   Medication Sig Start Date End Date Taking? Authorizing Provider  diclofenac sodium (VOLTAREN) 1 % GEL Apply 4 g topically 4 (four) times daily. 11/02/18   Wren Gallaga C, PA-C  methocarbamol (ROBAXIN) 500 MG tablet Take 1 tablet (500 mg total) by mouth 2 (two) times daily. 11/02/18   Krystyl Cannell, Hillard Danker, PA-C    Family History Family History  Problem Relation Age of Onset  . Chronic infections Mother   . Other Sister        MVC    Social History Social History   Tobacco Use  . Smoking status: Current Every Day Smoker    Packs/day: 0.50    Years: 17.00    Pack years: 8.50  . Smokeless tobacco: Never Used  Substance Use Topics  . Alcohol use: No  . Drug use: Yes    Types: Marijuana    Comment: to keep his seizures controlled     Allergies   Klonopin [clonazepam], Valium [diazepam], Xanax [alprazolam], Tylenol [acetaminophen], and Tape   Review of Systems Review of Systems  Constitutional: Positive for diaphoresis. Negative for chills and fever.  Respiratory: Positive for shortness of breath.  Negative for cough.   Cardiovascular: Positive for chest pain. Negative for leg swelling.  Gastrointestinal: Negative for abdominal pain, diarrhea, nausea and vomiting.  Musculoskeletal: Negative for back pain.  Neurological: Positive for weakness (generalized) and light-headedness. Negative for syncope and numbness.  All other systems reviewed and are negative.    Physical Exam Updated Vital Signs BP (!) 102/46 (BP Location: Right Arm)   Pulse 96   Temp 98.2 F (36.8 C) (Oral)   Resp 16   Physical Exam Vitals signs and nursing note reviewed.  Constitutional:      General: He is not in acute  distress.    Appearance: He is well-developed. He is not diaphoretic.  HENT:     Head: Normocephalic and atraumatic.     Mouth/Throat:     Mouth: Mucous membranes are moist.     Pharynx: Oropharynx is clear.  Eyes:     Conjunctiva/sclera: Conjunctivae normal.  Neck:     Musculoskeletal: Neck supple.  Cardiovascular:     Rate and Rhythm: Normal rate and regular rhythm.     Pulses: Normal pulses.          Radial pulses are 2+ on the right side and 2+ on the left side.       Posterior tibial pulses are 2+ on the right side and 2+ on the left side.     Heart sounds: Normal heart sounds.     Comments: Tactile temperature in the extremities appropriate and equal bilaterally. Pulmonary:     Effort: Pulmonary effort is normal. No respiratory distress.     Breath sounds: Normal breath sounds.  Chest:     Chest wall: Tenderness present. No deformity or crepitus.       Comments: No noted bruising, deformity, instability, or color change to the chest. Abdominal:     Palpations: Abdomen is soft.     Tenderness: There is no abdominal tenderness. There is no guarding.  Musculoskeletal:     Right lower leg: No edema.     Left lower leg: No edema.  Lymphadenopathy:     Cervical: No cervical adenopathy.  Skin:    General: Skin is warm and dry.  Neurological:     Mental Status: He is alert.  Psychiatric:        Mood and Affect: Mood and affect normal.        Speech: Speech normal.        Behavior: Behavior normal.      ED Treatments / Results  Labs (all labs ordered are listed, but only abnormal results are displayed) Labs Reviewed  COMPREHENSIVE METABOLIC PANEL - Abnormal; Notable for the following components:      Result Value   Glucose, Bld 119 (*)    All other components within normal limits  CBC WITH DIFFERENTIAL/PLATELET  TROPONIN I (HIGH SENSITIVITY)  TROPONIN I (HIGH SENSITIVITY)    EKG EKG Interpretation  Date/Time:  Tuesday November 02 2018 09:34:48 EDT  Ventricular Rate:  97 PR Interval:  132 QRS Duration: 88 QT Interval:  346 QTC Calculation: 439 R Axis:   90 Text Interpretation:  Normal sinus rhythm Rightward axis Borderline ECG No old tracing to compare Confirmed by Aletta Edouard [phone]) on 11/02/2018 9:56:00 AM   Radiology Dg Chest 2 View  Result Date: 11/02/2018 CLINICAL DATA:  Chest pain EXAM: CHEST - 2 VIEW COMPARISON:  01/25/2018 FINDINGS: The heart size and mediastinal contours are within normal limits. Both lungs are clear. The visualized skeletal structures are unremarkable.  IMPRESSION: No acute abnormality of the lungs. Electronically Signed   By: Lauralyn Primes M.D.   On: 11/02/2018 10:11    Procedures Procedures (including critical care time)  Medications Ordered in ED Medications  ibuprofen (ADVIL) tablet 600 mg (600 mg Oral Given 11/02/18 1320)  methocarbamol (ROBAXIN) tablet 500 mg (500 mg Oral Given 11/02/18 1320)     Initial Impression / Assessment and Plan / ED Course  I have reviewed the triage vital signs and the nursing notes.  Pertinent labs & imaging results that were available during my care of the patient were reviewed by me and considered in my medical decision making (see chart for details).        Patient presents with a complaint of chest pain.  Upon my assessment, patient's only complaint is chest soreness, reproducible on exam. Patient is nontoxic appearing, afebrile, not tachycardic, not tachypneic, not hypotensive, maintains excellent SPO2 on room air, and is in no apparent distress.    Low suspicion for ACS. HEART score is 1, indicating low risk for a cardiac event.  EKG without evidence of acute ischemia or pathologic/symptomatic arrhythmia. Wells criteria score is 0, indicating low risk for PE.   Dissection was considered, but thought less likely base on: History and description of the pain are not suggestive, patient is not ill-appearing, lack of risk factors, equal bilateral pulses, lack  of neurologic deficits, no widened mediastinum on chest x-ray. PERC negative. I was trying to get more information from the patient about his reported MI in 2018.  He states he was treated for this at [facility], however, on chart review I can find no findings consistent with MI.  Many of the history elements and physical exam findings suggest patient's pain may be musculoskeletal in nature. The patient was given instructions for home care as well as return precautions. Patient voices understanding of these instructions, accepts the plan, and is comfortable with discharge.  Findings and plan of care discussed with Erasmo Score, MD.   Vitals:   11/02/18 1315 11/02/18 1320 11/02/18 1400 11/02/18 1549  BP:  116/75 95/74 (!) 108/57  Pulse: 68 74 73 63  Resp:    16  Temp:    98 F (36.7 C)  TempSrc:      SpO2: 96% 99% 96%      Final Clinical Impressions(s) / ED Diagnoses   Final diagnoses:  Atypical chest pain    ED Discharge Orders         Ordered    diclofenac sodium (VOLTAREN) 1 % GEL  4 times daily     11/02/18 1441    methocarbamol (ROBAXIN) 500 MG tablet  2 times daily     11/02/18 1441           Anselm Pancoast, PA-C 11/03/18 0646    Terrilee Files, MD 11/03/18 [phone]
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
## 2022-04-03 DIAGNOSIS — R070 Pain in throat: Secondary | ICD-10-CM | POA: Diagnosis not present

## 2022-04-03 DIAGNOSIS — I10 Essential (primary) hypertension: Secondary | ICD-10-CM | POA: Diagnosis not present

## 2022-04-22 DIAGNOSIS — I10 Essential (primary) hypertension: Secondary | ICD-10-CM | POA: Diagnosis not present

## 2022-04-22 DIAGNOSIS — R609 Edema, unspecified: Secondary | ICD-10-CM | POA: Diagnosis not present

## 2022-06-23 DIAGNOSIS — R609 Edema, unspecified: Secondary | ICD-10-CM | POA: Diagnosis not present

## 2022-06-23 DIAGNOSIS — L0201 Cutaneous abscess of face: Secondary | ICD-10-CM | POA: Diagnosis not present

## 2022-08-13 DIAGNOSIS — R531 Weakness: Secondary | ICD-10-CM | POA: Diagnosis not present

## 2022-08-13 DIAGNOSIS — R569 Unspecified convulsions: Secondary | ICD-10-CM | POA: Diagnosis not present

## 2022-08-13 DIAGNOSIS — R42 Dizziness and giddiness: Secondary | ICD-10-CM | POA: Diagnosis not present

## 2022-08-13 DIAGNOSIS — M542 Cervicalgia: Secondary | ICD-10-CM | POA: Diagnosis not present

## 2022-12-31 DIAGNOSIS — L0390 Cellulitis, unspecified: Secondary | ICD-10-CM | POA: Diagnosis not present

## 2022-12-31 DIAGNOSIS — S6992XA Unspecified injury of left wrist, hand and finger(s), initial encounter: Secondary | ICD-10-CM | POA: Diagnosis not present

## 2022-12-31 DIAGNOSIS — L03114 Cellulitis of left upper limb: Secondary | ICD-10-CM | POA: Diagnosis not present
